# Patient Record
Sex: Male | Born: 1970 | Race: White | Hispanic: No | Marital: Married | Smoking: Former smoker
Health system: Southern US, Community
[De-identification: ages and names within clinical notes are randomized; demographics above are authoritative.]

## PROBLEM LIST (undated history)

## (undated) DIAGNOSIS — L409 Psoriasis, unspecified: Secondary | ICD-10-CM

## (undated) HISTORY — PX: EYE SURGERY: SHX253

## (undated) HISTORY — PX: WISDOM TOOTH EXTRACTION: SHX21

## (undated) HISTORY — DX: Psoriasis, unspecified: L40.9

---

## 2014-09-22 ENCOUNTER — Ambulatory Visit (INDEPENDENT_AMBULATORY_CARE_PROVIDER_SITE_OTHER): Payer: BLUE CROSS/BLUE SHIELD | Admitting: Internal Medicine

## 2014-09-22 ENCOUNTER — Telehealth (HOSPITAL_COMMUNITY): Payer: Self-pay

## 2014-09-22 ENCOUNTER — Other Ambulatory Visit (INDEPENDENT_AMBULATORY_CARE_PROVIDER_SITE_OTHER): Payer: BLUE CROSS/BLUE SHIELD

## 2014-09-22 ENCOUNTER — Encounter: Payer: Self-pay | Admitting: Internal Medicine

## 2014-09-22 VITALS — BP 126/82 | HR 80 | Temp 98.9°F | Resp 16 | Ht 74.0 in | Wt 191.0 lb

## 2014-09-22 DIAGNOSIS — Z0000 Encounter for general adult medical examination without abnormal findings: Secondary | ICD-10-CM | POA: Diagnosis not present

## 2014-09-22 DIAGNOSIS — L409 Psoriasis, unspecified: Secondary | ICD-10-CM | POA: Insufficient documentation

## 2014-09-22 DIAGNOSIS — Z23 Encounter for immunization: Secondary | ICD-10-CM

## 2014-09-22 DIAGNOSIS — K921 Melena: Secondary | ICD-10-CM | POA: Insufficient documentation

## 2014-09-22 DIAGNOSIS — R9431 Abnormal electrocardiogram [ECG] [EKG]: Secondary | ICD-10-CM | POA: Insufficient documentation

## 2014-09-22 DIAGNOSIS — Z136 Encounter for screening for cardiovascular disorders: Secondary | ICD-10-CM

## 2014-09-22 LAB — COMPREHENSIVE METABOLIC PANEL
ALK PHOS: 48 U/L (ref 39–117)
ALT: 35 U/L (ref 0–53)
AST: 26 U/L (ref 0–37)
Albumin: 4.8 g/dL (ref 3.5–5.2)
BILIRUBIN TOTAL: 0.7 mg/dL (ref 0.2–1.2)
BUN: 14 mg/dL (ref 6–23)
CO2: 32 mEq/L (ref 19–32)
Calcium: 9.8 mg/dL (ref 8.4–10.5)
Chloride: 103 mEq/L (ref 96–112)
Creatinine, Ser: 0.88 mg/dL (ref 0.40–1.50)
GFR: 99.26 mL/min (ref >60.00)
Glucose, Bld: 108 mg/dL — ABNORMAL HIGH (ref 70–99)
POTASSIUM: 4.7 meq/L (ref 3.5–5.1)
Sodium: 140 mEq/L (ref 135–145)
TOTAL PROTEIN: 7.5 g/dL (ref 6.0–8.3)

## 2014-09-22 LAB — CBC WITH DIFFERENTIAL/PLATELET
Basophils Absolute: 0 10*3/uL (ref 0.0–0.1)
Basophils Relative: 0.6 % (ref 0.0–3.0)
EOS ABS: 0.1 10*3/uL (ref 0.0–0.7)
EOS PCT: 1.8 % (ref 0.0–5.0)
HCT: 45.6 % (ref 39.0–52.0)
Hemoglobin: 15.8 g/dL (ref 13.0–17.0)
LYMPHS ABS: 1.5 10*3/uL (ref 0.7–4.0)
LYMPHS PCT: 22.1 % (ref 12.0–46.0)
MCHC: 34.6 g/dL (ref 30.0–36.0)
MCV: 89.7 fl (ref 78.0–100.0)
Monocytes Absolute: 0.7 10*3/uL (ref 0.1–1.0)
Monocytes Relative: 10 % (ref 3.0–12.0)
NEUTROS PCT: 65.5 % (ref 43.0–77.0)
Neutro Abs: 4.3 10*3/uL (ref 1.4–7.7)
Platelets: 279 10*3/uL (ref 150.0–400.0)
RBC: 5.08 Mil/uL (ref 4.22–5.81)
RDW: 13 % (ref 11.5–15.5)
WBC: 6.6 10*3/uL (ref 4.0–10.5)

## 2014-09-22 LAB — LIPID PANEL
Cholesterol: 182 mg/dL (ref 0–200)
HDL: 66.1 mg/dL (ref >39.00)
LDL CALC: 105 mg/dL — AB (ref 0–99)
NonHDL: 116.06
TRIGLYCERIDES: 56 mg/dL (ref 0.0–149.0)
Total CHOL/HDL Ratio: 3
VLDL: 11.2 mg/dL (ref 0.0–40.0)

## 2014-09-22 LAB — TSH: TSH: 3.74 u[IU]/mL (ref 0.35–4.50)

## 2014-09-22 LAB — FECAL OCCULT BLOOD, GUAIAC: Fecal Occult Blood: POSITIVE

## 2014-09-22 NOTE — Progress Notes (Signed)
Subjective:  Patient ID: Samuel Wong, male    DOB: 08/30/1970  Age: 44 y.o. MRN: [id number]  CC: Annual Exam   HPI [name] presents for a complete physical. His main concern today is whether or not he has heart disease. He has been told by dentist that he has severe gingivitis and he has a history of psoriasis and knows that these are risk factors for coronary artery disease. He also had a second degree relative that had a heart attack and died at age of 20. He currently exercises doing speed walking for 25 minutes about 3-4 times a week and does not have any dyspnea on exertion, chest pain, dizziness, diaphoresis or decreased endurance. He also complains of intermittent episodes of painless blood in his stool for several years.  History Rayn has a past medical history of Psoriasis.   He has no past surgical history on file.   His family history includes Arthritis in his mother; Heart disease in his paternal grandfather. There is no history of Cancer, Hyperlipidemia, Hypertension, Stroke, Kidney disease, COPD, Diabetes, Early death, or Alcohol abuse.He reports that he quit smoking about 16 years ago. He has never used smokeless tobacco. He reports that he drinks about 9.0 oz of alcohol per week. He reports that he does not use illicit drugs.  No outpatient prescriptions prior to visit.   No facility-administered medications prior to visit.    ROS Review of Systems  Constitutional: Negative.  Negative for fever, chills, diaphoresis, appetite change and fatigue.  HENT: Negative.  Negative for trouble swallowing and voice change.   Eyes: Negative.   Respiratory: Negative.  Negative for apnea, cough, choking, chest tightness, shortness of breath, wheezing and stridor.   Cardiovascular: Negative.  Negative for chest pain, palpitations and leg swelling.  Gastrointestinal: Positive for blood in stool and anal bleeding. Negative for vomiting, abdominal pain, diarrhea,  constipation, abdominal distention and rectal pain.  Endocrine: Negative.   Genitourinary: Negative.  Negative for difficulty urinating.  Musculoskeletal: Negative.   Skin: Negative.   Allergic/Immunologic: Negative.   Neurological: Negative.  Negative for dizziness, tremors, weakness and light-headedness.  Hematological: Negative.   Psychiatric/Behavioral: Negative.     Objective:  BP 126/82 mmHg  Pulse 80  Temp(Src) 98.9 F (37.2 C) (Oral)  Resp 16  Ht 6\' 2"  (1.88 m)  Wt 191 lb (86.637 kg)  BMI 24.51 kg/m2  SpO2 97%  Physical Exam  Constitutional: He appears well-developed and well-nourished. No distress.  HENT:  Head: Normocephalic and atraumatic.  Mouth/Throat: Oropharynx is clear and moist. No oropharyngeal exudate.  Eyes: Conjunctivae are normal. Right eye exhibits no discharge. Left eye exhibits no discharge. No scleral icterus.  Neck: Normal range of motion. Neck supple. No JVD present. No tracheal deviation present. No thyromegaly present.  Cardiovascular: Normal rate, regular rhythm, normal heart sounds and intact distal pulses.  Exam reveals no gallop and no friction rub.   No murmur heard. Pulses:      Carotid pulses are 1+ on the right side, and 1+ on the left side.      Radial pulses are 1+ on the right side, and 1+ on the left side.       Femoral pulses are 1+ on the right side, and 1+ on the left side.      Popliteal pulses are 1+ on the right side, and 1+ on the left side.       Dorsalis pedis pulses are 1+ on the right side, and 1+  on the left side.       Posterior tibial pulses are 1+ on the right side, and 1+ on the left side.  EKG today  Sinus  Rhythm  -Anterolateral ST-elevation -repolarization variant.   PROBABLY NORMAL   Pulmonary/Chest: Effort normal and breath sounds normal. No stridor. No respiratory distress. He has no wheezes. He has no rales. He exhibits no tenderness.  Abdominal: Soft. Bowel sounds are normal. He exhibits no distension and  no mass. There is no tenderness. There is no rebound and no guarding. Hernia confirmed negative in the right inguinal area and confirmed negative in the left inguinal area.  Genitourinary: Prostate normal, testes normal and penis normal. Rectal exam shows external hemorrhoid and internal hemorrhoid. Rectal exam shows no fissure, no mass, no tenderness and anal tone normal. Guaiac positive stool. Prostate is not enlarged and not tender. Right testis shows no mass, no swelling and no tenderness. Right testis is descended. Left testis shows no mass, no swelling and no tenderness. Left testis is descended. Circumcised. No penile erythema or penile tenderness. No discharge found.  Lymphadenopathy:    He has no cervical adenopathy.       Right: No inguinal adenopathy present.       Left: No inguinal adenopathy present.  Skin: He is not diaphoretic.  Vitals reviewed.     Assessment & Plan:   Luigi was seen today for annual exam.  Diagnoses and all orders for this visit:  Psoriasis  Blood in stool- this is most likely hemorrhoidal bleeding but he will need lower endoscopy to rule out other causes such as colon cancer or or colon polyps. Orders: -     Ambulatory referral to Gastroenterology  Routine general medical examination at a health care facility- exam done, he was given a Tdap booster, labs ordered and will be reviewed. He was referred for colonoscopy. He was given patient education material. Orders: -     Lipid panel; Future -     Comprehensive metabolic panel; Future -     CBC with Differential/Platelet; Future -     TSH; Future  Screening, ischemic heart disease Orders: -     EKG 12-Lead  Need for Tdap vaccination Orders: -     Tdap vaccine greater than or equal to 7yo IM  Nonspecific abnormal electrocardiogram (ECG) (EKG)- he does not have any symptoms of ischemic heart disease however he does have risk factors and his EKG is mildly abnormal. I therefore asked him to have a  myocardial perfusion imaging scan done to screen for coronary artery disease. Orders: -     Myocardial Perfusion Imaging; Future   Mr. Pistole does not currently have medications on file.  No orders of the defined types were placed in this encounter.     Follow-up: Return in about 3 months (around 12/23/2014).  Scarlette Calico, MD

## 2014-09-22 NOTE — Patient Instructions (Signed)

## 2014-09-22 NOTE — Progress Notes (Signed)
Pre visit review using our clinic review tool, if applicable. No additional management support is needed unless otherwise documented below in the visit note. 

## 2014-09-22 NOTE — Telephone Encounter (Signed)
Patient given detailed instructions per Myocardial Perfusion Study Information Sheet for test on 09-23-2014 at 0745. Patient Notified to arrive 15 minutes early, and that it is imperative to arrive on time for appointment to keep from having the test rescheduled. Patient verbalized understanding. Oletta Lamas, Cintia Gleed A

## 2014-09-23 ENCOUNTER — Ambulatory Visit (HOSPITAL_COMMUNITY): Payer: BLUE CROSS/BLUE SHIELD | Attending: Internal Medicine

## 2014-09-23 DIAGNOSIS — R9431 Abnormal electrocardiogram [ECG] [EKG]: Secondary | ICD-10-CM | POA: Diagnosis not present

## 2014-09-23 DIAGNOSIS — R9439 Abnormal result of other cardiovascular function study: Secondary | ICD-10-CM | POA: Insufficient documentation

## 2014-09-23 LAB — MYOCARDIAL PERFUSION IMAGING
CHL CUP NUCLEAR SSS: 3
CHL CUP RESTING HR STRESS: 59 {beats}/min
CHL RATE OF PERCEIVED EXERTION: 15
CSEPEDS: 33 s
CSEPPHR: 179 {beats}/min
Estimated workload: 14.4 METS
Exercise duration (min): 12 min
LHR: 0.28
LV dias vol: 119 mL
LV sys vol: 55 mL
MPHR: 175 {beats}/min
Percent HR: 1 %
SDS: 3
SRS: 0
TID: 0.84

## 2014-09-23 MED ORDER — TECHNETIUM TC 99M SESTAMIBI GENERIC - CARDIOLITE
30.7000 | Freq: Once | INTRAVENOUS | Status: AC | PRN
  Administered 2014-09-23: 30.7 via INTRAVENOUS

## 2014-09-23 MED ORDER — TECHNETIUM TC 99M SESTAMIBI GENERIC - CARDIOLITE
9.9000 | Freq: Once | INTRAVENOUS | Status: AC | PRN
  Administered 2014-09-23: 10 via INTRAVENOUS

## 2014-09-25 ENCOUNTER — Encounter: Payer: Self-pay | Admitting: Internal Medicine

## 2014-12-27 ENCOUNTER — Ambulatory Visit: Payer: BLUE CROSS/BLUE SHIELD | Admitting: Internal Medicine

## 2015-08-26 ENCOUNTER — Encounter (HOSPITAL_COMMUNITY)
Admission: EM | Disposition: A | Discharge status: Home / Self Care | Payer: Self-pay | Source: Home / Self Care | Attending: Emergency Medicine

## 2015-08-26 ENCOUNTER — Emergency Department (HOSPITAL_COMMUNITY): Payer: BLUE CROSS/BLUE SHIELD

## 2015-08-26 ENCOUNTER — Observation Stay (HOSPITAL_COMMUNITY): Payer: BLUE CROSS/BLUE SHIELD | Admitting: Anesthesiology

## 2015-08-26 ENCOUNTER — Encounter (HOSPITAL_COMMUNITY): Payer: Self-pay | Admitting: Emergency Medicine

## 2015-08-26 ENCOUNTER — Observation Stay (HOSPITAL_COMMUNITY)
Admission: EM | Admit: 2015-08-26 | Discharge: 2015-08-27 | Disposition: A | Discharge status: Home / Self Care | Payer: BLUE CROSS/BLUE SHIELD | Attending: Surgery | Admitting: Surgery

## 2015-08-26 ENCOUNTER — Telehealth: Payer: Self-pay | Admitting: General Surgery

## 2015-08-26 DIAGNOSIS — Z87891 Personal history of nicotine dependence: Secondary | ICD-10-CM | POA: Insufficient documentation

## 2015-08-26 DIAGNOSIS — K3580 Unspecified acute appendicitis: Secondary | ICD-10-CM | POA: Diagnosis not present

## 2015-08-26 HISTORY — PX: LAPAROSCOPIC APPENDECTOMY: SHX408

## 2015-08-26 LAB — URINALYSIS, ROUTINE W REFLEX MICROSCOPIC
BILIRUBIN URINE: NEGATIVE
Glucose, UA: NEGATIVE mg/dL
Hgb urine dipstick: NEGATIVE
KETONES UR: NEGATIVE mg/dL
NITRITE: NEGATIVE
PH: 6.5 (ref 5.0–8.0)
PROTEIN: NEGATIVE mg/dL
Specific Gravity, Urine: 1.011 (ref 1.005–1.030)

## 2015-08-26 LAB — CBC
HEMATOCRIT: 40.8 % (ref 39.0–52.0)
HEMOGLOBIN: 14.9 g/dL (ref 13.0–17.0)
MCH: 31.5 pg (ref 26.0–34.0)
MCHC: 36.5 g/dL — ABNORMAL HIGH (ref 30.0–36.0)
MCV: 86.3 fL (ref 78.0–100.0)
PLATELETS: 239 10*3/uL (ref 150–400)
RBC: 4.73 MIL/uL (ref 4.22–5.81)
RDW: 12.3 % (ref 11.5–15.5)
WBC: 20.1 10*3/uL — AB (ref 4.0–10.5)

## 2015-08-26 LAB — COMPREHENSIVE METABOLIC PANEL
ALT: 26 U/L (ref 17–63)
AST: 25 U/L (ref 15–41)
Albumin: 4 g/dL (ref 3.5–5.0)
Alkaline Phosphatase: 48 U/L (ref 38–126)
Anion gap: 10 (ref 5–15)
BUN: 14 mg/dL (ref 6–20)
CHLORIDE: 103 mmol/L (ref 101–111)
CO2: 23 mmol/L (ref 22–32)
Calcium: 9.2 mg/dL (ref 8.9–10.3)
Creatinine, Ser: 0.98 mg/dL (ref 0.61–1.24)
Glucose, Bld: 135 mg/dL — ABNORMAL HIGH (ref 65–99)
POTASSIUM: 3.5 mmol/L (ref 3.5–5.1)
Sodium: 136 mmol/L (ref 135–145)
Total Bilirubin: 1.5 mg/dL — ABNORMAL HIGH (ref 0.3–1.2)
Total Protein: 6.8 g/dL (ref 6.5–8.1)

## 2015-08-26 LAB — URINE MICROSCOPIC-ADD ON: RBC / HPF: NONE SEEN RBC/hpf (ref 0–5)

## 2015-08-26 LAB — LIPASE, BLOOD: LIPASE: 22 U/L (ref 11–51)

## 2015-08-26 SURGERY — APPENDECTOMY, LAPAROSCOPIC
Anesthesia: General | Site: Abdomen

## 2015-08-26 MED ORDER — 0.9 % SODIUM CHLORIDE (POUR BTL) OPTIME
TOPICAL | Status: DC | PRN
  Administered 2015-08-26: 1000 mL

## 2015-08-26 MED ORDER — METRONIDAZOLE IN NACL 5-0.79 MG/ML-% IV SOLN
500.0000 mg | Freq: Once | INTRAVENOUS | Status: AC
  Administered 2015-08-26: 500 mg via INTRAVENOUS
  Filled 2015-08-26: qty 100

## 2015-08-26 MED ORDER — FENTANYL CITRATE (PF) 100 MCG/2ML IJ SOLN
INTRAMUSCULAR | Status: DC | PRN
  Administered 2015-08-26 (×2): 50 ug via INTRAVENOUS
  Administered 2015-08-26: 100 ug via INTRAVENOUS
  Administered 2015-08-26: 50 ug via INTRAVENOUS

## 2015-08-26 MED ORDER — KETOROLAC TROMETHAMINE 30 MG/ML IJ SOLN
INTRAMUSCULAR | Status: AC
  Filled 2015-08-26: qty 1

## 2015-08-26 MED ORDER — ACETAMINOPHEN 10 MG/ML IV SOLN
INTRAVENOUS | Status: AC
  Filled 2015-08-26: qty 100

## 2015-08-26 MED ORDER — PROPOFOL 10 MG/ML IV BOLUS
INTRAVENOUS | Status: AC
  Filled 2015-08-26: qty 20

## 2015-08-26 MED ORDER — ONDANSETRON HCL 4 MG/2ML IJ SOLN
INTRAMUSCULAR | Status: AC
  Filled 2015-08-26: qty 2

## 2015-08-26 MED ORDER — OXYCODONE HCL 5 MG/5ML PO SOLN: 5.0000 mg | Freq: Once | ORAL | Status: DC | PRN

## 2015-08-26 MED ORDER — KETOROLAC TROMETHAMINE 30 MG/ML IJ SOLN
INTRAMUSCULAR | Status: DC | PRN
  Administered 2015-08-26: 30 mg via INTRAVENOUS

## 2015-08-26 MED ORDER — FENTANYL CITRATE (PF) 250 MCG/5ML IJ SOLN
INTRAMUSCULAR | Status: AC
  Filled 2015-08-26: qty 5

## 2015-08-26 MED ORDER — BUPIVACAINE-EPINEPHRINE 0.5% -1:200000 IJ SOLN
INTRAMUSCULAR | Status: DC | PRN
  Administered 2015-08-26: 20 mL

## 2015-08-26 MED ORDER — MIDAZOLAM HCL 5 MG/5ML IJ SOLN
INTRAMUSCULAR | Status: DC | PRN
  Administered 2015-08-26: 2 mg via INTRAVENOUS

## 2015-08-26 MED ORDER — ONDANSETRON HCL 4 MG/2ML IJ SOLN
4.0000 mg | Freq: Four times a day (QID) | INTRAMUSCULAR | Status: DC | PRN
  Administered 2015-08-26 (×2): 4 mg via INTRAVENOUS
  Filled 2015-08-26: qty 2

## 2015-08-26 MED ORDER — PROPOFOL 10 MG/ML IV BOLUS
INTRAVENOUS | Status: DC | PRN
  Administered 2015-08-26: 140 mg via INTRAVENOUS
  Administered 2015-08-26: 60 mg via INTRAVENOUS

## 2015-08-26 MED ORDER — OXYCODONE-ACETAMINOPHEN 5-325 MG PO TABS
1.0000 | ORAL_TABLET | ORAL | Status: DC | PRN
  Administered 2015-08-26 – 2015-08-27 (×5): 2 via ORAL
  Filled 2015-08-26 (×6): qty 2

## 2015-08-26 MED ORDER — SODIUM CHLORIDE 0.9 % IV SOLN
INTRAVENOUS | Status: DC
  Administered 2015-08-26: 11:00:00 via INTRAVENOUS

## 2015-08-26 MED ORDER — HYDROMORPHONE HCL 1 MG/ML IJ SOLN: 1.0000 mg | INTRAMUSCULAR | Status: DC | PRN

## 2015-08-26 MED ORDER — ARTIFICIAL TEARS OP OINT
TOPICAL_OINTMENT | OPHTHALMIC | Status: DC | PRN
  Administered 2015-08-26: 1 via OPHTHALMIC

## 2015-08-26 MED ORDER — ROCURONIUM BROMIDE 100 MG/10ML IV SOLN
INTRAVENOUS | Status: DC | PRN
  Administered 2015-08-26: 50 mg via INTRAVENOUS

## 2015-08-26 MED ORDER — SODIUM CHLORIDE 0.9 % IR SOLN
Status: DC | PRN
  Administered 2015-08-26: 1000 mL

## 2015-08-26 MED ORDER — LIDOCAINE HCL (CARDIAC) 20 MG/ML IV SOLN
INTRAVENOUS | Status: DC | PRN
Start: 2015-08-26 — End: 2015-08-26
  Administered 2015-08-26: 70 mg via INTRAVENOUS

## 2015-08-26 MED ORDER — DEXAMETHASONE SODIUM PHOSPHATE 10 MG/ML IJ SOLN
INTRAMUSCULAR | Status: DC | PRN
  Administered 2015-08-26: 10 mg via INTRAVENOUS

## 2015-08-26 MED ORDER — ACETAMINOPHEN 10 MG/ML IV SOLN
INTRAVENOUS | Status: DC | PRN
  Administered 2015-08-26: 1000 mg via INTRAVENOUS

## 2015-08-26 MED ORDER — HYDROMORPHONE HCL 1 MG/ML IJ SOLN
0.5000 mg | INTRAMUSCULAR | Status: DC | PRN
  Administered 2015-08-26: 0.5 mg via INTRAVENOUS
  Filled 2015-08-26: qty 1

## 2015-08-26 MED ORDER — ROCURONIUM BROMIDE 50 MG/5ML IV SOLN
INTRAVENOUS | Status: AC
  Filled 2015-08-26: qty 1

## 2015-08-26 MED ORDER — BUPIVACAINE-EPINEPHRINE (PF) 0.5% -1:200000 IJ SOLN
INTRAMUSCULAR | Status: AC
  Filled 2015-08-26: qty 30

## 2015-08-26 MED ORDER — ONDANSETRON 4 MG PO TBDP: 4.0000 mg | ORAL_TABLET | Freq: Four times a day (QID) | ORAL | Status: DC | PRN

## 2015-08-26 MED ORDER — DEXAMETHASONE SODIUM PHOSPHATE 10 MG/ML IJ SOLN
INTRAMUSCULAR | Status: AC
  Filled 2015-08-26: qty 1

## 2015-08-26 MED ORDER — HYDROMORPHONE HCL 1 MG/ML IJ SOLN
1.0000 mg | Freq: Once | INTRAMUSCULAR | Status: AC
  Administered 2015-08-26: 1 mg via INTRAVENOUS
  Filled 2015-08-26: qty 1

## 2015-08-26 MED ORDER — LACTATED RINGERS IV SOLN
INTRAVENOUS | Status: DC
  Administered 2015-08-26 (×4): via INTRAVENOUS

## 2015-08-26 MED ORDER — NEOSTIGMINE METHYLSULFATE 10 MG/10ML IV SOLN
INTRAVENOUS | Status: DC | PRN
  Administered 2015-08-26: 5 mg via INTRAVENOUS

## 2015-08-26 MED ORDER — HYDROMORPHONE HCL 1 MG/ML IJ SOLN
1.0000 mg | INTRAMUSCULAR | Status: DC | PRN
  Administered 2015-08-26 (×2): 1 mg via INTRAVENOUS
  Filled 2015-08-26 (×2): qty 1

## 2015-08-26 MED ORDER — HYDROMORPHONE HCL 1 MG/ML IJ SOLN: 0.2500 mg | INTRAMUSCULAR | Status: DC | PRN

## 2015-08-26 MED ORDER — CEFTRIAXONE SODIUM 2 G IJ SOLR
2.0000 g | Freq: Once | INTRAMUSCULAR | Status: AC
  Administered 2015-08-26: 2 g via INTRAVENOUS
  Filled 2015-08-26: qty 2

## 2015-08-26 MED ORDER — IOPAMIDOL (ISOVUE-300) INJECTION 61%
INTRAVENOUS | Status: AC
  Administered 2015-08-26: 100 mL via INTRAVENOUS
  Filled 2015-08-26: qty 100

## 2015-08-26 MED ORDER — GLYCOPYRROLATE 0.2 MG/ML IJ SOLN
INTRAMUSCULAR | Status: DC | PRN
  Administered 2015-08-26: 0.6 mg via INTRAVENOUS

## 2015-08-26 MED ORDER — GLYCOPYRROLATE 0.2 MG/ML IV SOSY
PREFILLED_SYRINGE | INTRAVENOUS | Status: AC
  Filled 2015-08-26: qty 6

## 2015-08-26 MED ORDER — OXYCODONE HCL 5 MG PO TABS: 5.0000 mg | ORAL_TABLET | Freq: Once | ORAL | Status: DC | PRN

## 2015-08-26 MED ORDER — MIDAZOLAM HCL 2 MG/2ML IJ SOLN
INTRAMUSCULAR | Status: AC
  Filled 2015-08-26: qty 2

## 2015-08-26 MED ORDER — ONDANSETRON HCL 4 MG/2ML IJ SOLN
4.0000 mg | Freq: Once | INTRAMUSCULAR | Status: AC | PRN
  Administered 2015-08-26: 4 mg via INTRAVENOUS
  Filled 2015-08-26: qty 2

## 2015-08-26 SURGICAL SUPPLY — 43 items
APPLIER CLIP 5 13 M/L LIGAMAX5 (MISCELLANEOUS)
APPLIER CLIP ROT 10 11.4 M/L (STAPLE)
CANISTER SUCTION 2500CC (MISCELLANEOUS) ×3 IMPLANT
CHLORAPREP W/TINT 26ML (MISCELLANEOUS) ×3 IMPLANT
CLIP APPLIE 5 13 M/L LIGAMAX5 (MISCELLANEOUS) IMPLANT
CLIP APPLIE ROT 10 11.4 M/L (STAPLE) IMPLANT
COVER SURGICAL LIGHT HANDLE (MISCELLANEOUS) ×3 IMPLANT
CUTTER FLEX LINEAR 45M (STAPLE) IMPLANT
DECANTER SPIKE VIAL GLASS SM (MISCELLANEOUS) ×3 IMPLANT
ELECT REM PT RETURN 9FT ADLT (ELECTROSURGICAL) ×3
ELECTRODE REM PT RTRN 9FT ADLT (ELECTROSURGICAL) ×1 IMPLANT
GLOVE BIO SURGEON STRL SZ 6.5 (GLOVE) ×2 IMPLANT
GLOVE BIO SURGEONS STRL SZ 6.5 (GLOVE) ×1
GLOVE BIOGEL PI IND STRL 7.0 (GLOVE) ×1 IMPLANT
GLOVE BIOGEL PI INDICATOR 7.0 (GLOVE) ×2
GLOVE ECLIPSE 7.5 STRL STRAW (GLOVE) ×3 IMPLANT
GLOVE SURG SIGNA 7.5 PF LTX (GLOVE) ×3 IMPLANT
GLOVE SURG SS PI 6.5 STRL IVOR (GLOVE) ×3 IMPLANT
GOWN STRL REUS W/ TWL LRG LVL3 (GOWN DISPOSABLE) ×3 IMPLANT
GOWN STRL REUS W/ TWL XL LVL3 (GOWN DISPOSABLE) ×1 IMPLANT
GOWN STRL REUS W/TWL LRG LVL3 (GOWN DISPOSABLE) ×6
GOWN STRL REUS W/TWL XL LVL3 (GOWN DISPOSABLE) ×2
KIT BASIN OR (CUSTOM PROCEDURE TRAY) ×3 IMPLANT
KIT ROOM TURNOVER OR (KITS) ×3 IMPLANT
LIQUID BAND (GAUZE/BANDAGES/DRESSINGS) ×3 IMPLANT
NS IRRIG 1000ML POUR BTL (IV SOLUTION) ×3 IMPLANT
PAD ARMBOARD 7.5X6 YLW CONV (MISCELLANEOUS) ×6 IMPLANT
POUCH SPECIMEN RETRIEVAL 10MM (ENDOMECHANICALS) ×3 IMPLANT
RELOAD 45 VASCULAR/THIN (ENDOMECHANICALS) IMPLANT
RELOAD STAPLE TA45 3.5 REG BLU (ENDOMECHANICALS) ×3 IMPLANT
SCALPEL HARMONIC ACE (MISCELLANEOUS) ×3 IMPLANT
SET IRRIG TUBING LAPAROSCOPIC (IRRIGATION / IRRIGATOR) ×3 IMPLANT
SLEEVE ENDOPATH XCEL 5M (ENDOMECHANICALS) ×3 IMPLANT
SPECIMEN JAR SMALL (MISCELLANEOUS) ×3 IMPLANT
SUT MNCRL AB 4-0 PS2 18 (SUTURE) ×3 IMPLANT
SUT MON AB 4-0 PC3 18 (SUTURE) ×3 IMPLANT
TOWEL OR 17X24 6PK STRL BLUE (TOWEL DISPOSABLE) ×3 IMPLANT
TOWEL OR 17X26 10 PK STRL BLUE (TOWEL DISPOSABLE) ×3 IMPLANT
TRAY FOLEY CATH SILVER 14FR (SET/KITS/TRAYS/PACK) ×3 IMPLANT
TRAY LAPAROSCOPIC MC (CUSTOM PROCEDURE TRAY) ×3 IMPLANT
TROCAR XCEL BLUNT TIP 100MML (ENDOMECHANICALS) ×3 IMPLANT
TROCAR XCEL NON-BLD 5MMX100MML (ENDOMECHANICALS) ×3 IMPLANT
TUBING INSUFFLATION (TUBING) ×3 IMPLANT

## 2015-08-26 NOTE — ED Provider Notes (Signed)
CSN: [id number]     Arrival date & time 08/26/15  0612 History   First MD Initiated Contact with Patient 08/26/15 [phone]     Chief Complaint  Patient presents with  . Abdominal Pain     (Consider location/radiation/quality/duration/timing/severity/associated sxs/prior Treatment) HPI Samuel Wong is a 45 y.o. male with no medical problems, presents to ED with complaint of abdominal pain. Pt states pain started yesterday. States started mild and worsened since. Patient states that pain is sharp, radiates all over his abdomen. Denies a bowel movement in the last 24 hours. He reports some nausea and one episode of emesis yesterday. He states pain is worsened with any movement or palpation of his abdomen. Denies any known fever. Denies any chills. He has not eaten since yesterday morning. He did have 2 beers last night. He took some Pepto-Bismol which she states did not help. He denies any prior abdominal issues or surgeries. Denies any back pain. Denies any pain or swelling in his scrotum. Denies any urinary symptoms. No hx of same pain  Past Medical History  Diagnosis Date  . Psoriasis    Past Surgical History  Procedure Laterality Date  . Eye surgery    . Wisdom tooth extraction     Family History  Problem Relation Age of Onset  . Arthritis Mother   . Heart disease Paternal Grandfather   . Cancer Neg Hx   . Hyperlipidemia Neg Hx   . Hypertension Neg Hx   . Stroke Neg Hx   . Kidney disease Neg Hx   . COPD Neg Hx   . Diabetes Neg Hx   . Early death Neg Hx   . Alcohol abuse Neg Hx    Social History  Substance Use Topics  . Smoking status: Former Smoker -- 10.00 packs/day    Quit date: 02/19/1998  . Smokeless tobacco: Never Used  . Alcohol Use: 9.0 oz/week    15 Cans of beer, 0 Standard drinks or equivalent per week    Review of Systems  Constitutional: Negative for fever and chills.  Respiratory: Negative for cough, chest tightness and shortness of breath.    Cardiovascular: Negative for chest pain, palpitations and leg swelling.  Gastrointestinal: Positive for nausea, vomiting and abdominal pain. Negative for diarrhea and abdominal distention.  Genitourinary: Negative for dysuria, urgency, frequency and hematuria.  Musculoskeletal: Negative for myalgias, arthralgias, neck pain and neck stiffness.  Skin: Negative for rash.  Allergic/Immunologic: Negative for immunocompromised state.  Neurological: Negative for dizziness, weakness, light-headedness, numbness and headaches.  All other systems reviewed and are negative.     Allergies  Review of patient's allergies indicates no known allergies.  Home Medications   Prior to Admission medications   Not on File   BP 126/69 mmHg  Pulse 93  Temp(Src) 97.8 F (36.6 C) (Oral)  Ht 6\' 1"  (1.854 m)  Wt 92.08 kg  BMI 26.79 kg/m2  SpO2 100% Physical Exam  Constitutional: He appears well-developed and well-nourished. No distress.  HENT:  Head: Normocephalic and atraumatic.  Eyes: Conjunctivae are normal.  Neck: Neck supple.  Cardiovascular: Normal rate, regular rhythm and normal heart sounds.   Pulmonary/Chest: Effort normal. No respiratory distress. He has no wheezes. He has no rales.  Abdominal: Soft. Bowel sounds are normal. He exhibits no distension. There is tenderness. There is no rebound.  RLQ tenderness with guarding  Musculoskeletal: He exhibits no edema.  Neurological: He is alert.  Skin: Skin is warm and dry.  Nursing note and vitals  reviewed.   ED Course  Procedures (including critical care time) Labs Review Labs Reviewed  LIPASE, BLOOD  COMPREHENSIVE METABOLIC PANEL  CBC  URINALYSIS, ROUTINE W REFLEX MICROSCOPIC (NOT AT [facility])    Imaging Review No results found. I have personally reviewed and evaluated these images and lab results as part of my medical decision-making.   EKG Interpretation None      MDM   Final diagnoses:  None   6:54 AM  Patient emergency  department with right lower quadrant pain. Exam is concerning for acute abdomen. CT scan of abd/pelvis ordered. Labs pending. VS normal at this time.   No pain improvement after 0.5 mg of Dilaudid. Labs show elevated white blood cell count of 20. CT scan is pending. The rest of the blood work is unremarkable.  8:03 AM Patient's pain is improved after 1 mg of Dilaudid. CT scan is positive for acute appendicitis with no evidence of an abscess or perforation. Will page general surgery. Antibiotics ordered.  Spoke with general surgery. Will admit.   Filed Vitals:   08/26/15 1400 08/26/15 1401 08/26/15 1424 08/26/15 1450  BP:  120/63 119/68 118/63  Pulse: 109 101 105 105  Temp: 100 F (37.8 C)  99 F (37.2 C) 99 F (37.2 C)  TempSrc:    Oral  Resp: 21 20 18 18   Height:      Weight:      SpO2: 93% 93% 95% 97%     Jeannett Senior, PA-C 08/26/15 [place], MD 08/27/15 [phone]

## 2015-08-26 NOTE — ED Notes (Signed)
Patient transported to CT 

## 2015-08-26 NOTE — Op Note (Signed)
Appendectomy, Lap, Procedure Note  Indications: The patient presented with a history of right-sided abdominal pain. A CT revealed findings consistent with acute appendicitis.  Pre-operative Diagnosis: acute appendicitis  Post-operative Diagnosis: Same  Surgeon: Coralie Keens A   Assistants: 0  Anesthesia: General endotracheal anesthesia  ASA Class: 2  Procedure Details  The patient was seen again in the Holding Room. The risks, benefits, complications, treatment options, and expected outcomes were discussed with the patient and/or family. The possibilities of reaction to medication, perforation of viscus, bleeding, recurrent infection, finding a normal appendix, the need for additional procedures, failure to diagnose a condition, and creating a complication requiring transfusion or operation were discussed. There was concurrence with the proposed plan and informed consent was obtained. The site of surgery was properly noted. The patient was taken to Operating Room, identified as Samuel Wong and the procedure verified as Appendectomy. A Time Out was held and the above information confirmed.  The patient was placed in the supine position and general anesthesia was induced, along with placement of orogastric tube, Venodyne boots, and a Foley catheter. The abdomen was prepped and draped in a sterile fashion. A one centimeter infraumbilical incision was made.  The  midline fascia was incised with a #15 blade.  A Kelly clamp was used to confirm entrance into the peritoneal cavity.  A pursestring suture was passed around the incision with a 0 Vicryl.  The Hasson was introduced into the abdomen and the tails of the suture were used to hold the Hasson in place.   The pneumoperitoneum was then established to steady pressure of 15 mmHg.  Additional 5 mm cannulas then placed in the left lower quadrant of the abdomen and the RUQ under direct visualization. A careful evaluation of the entire abdomen  was carried out. The patient was placed in Trendelenburg and left lateral decubitus position. The small intestines were retracted in the cephalad and left lateral direction away from the pelvis and right lower quadrant. The patient was found to have an enlarged and inflamed appendix that was extending into the pelvis. There was no evidence of perforation but it did have slight early necrosis and fibrinous exudate  The appendix was carefully dissected. The appendix was was skeletonized with the harmonic scalpel.   The appendix was divided at its base using an endo-GIA stapler. Minimal appendiceal stump was left in place. There was no evidence of bleeding, leakage, or complication after division of the appendix. Irrigation was also performed and irrigate suctioned from the abdomen as well.  The umbilical port site was closed with the purse string suture. There was no residual palpable fascial defect.  The trocar site skin wounds were closed with 4-0 Monocryl.  Instrument, sponge, and needle counts were correct at the conclusion of the case.   Findings: The appendix was found to be inflamed. There were signs of necrosis.  There was not perforation. There was not abscess formation.  Estimated Blood Loss:  Minimal         Drains:none         Complications:  None; patient tolerated the procedure well.         Disposition: PACU - hemodynamically stable.         Condition: stable

## 2015-08-26 NOTE — Anesthesia Preprocedure Evaluation (Signed)
Anesthesia Evaluation  Patient identified by MRN, date of birth, ID band Patient awake    Reviewed: Allergy & Precautions, NPO status , Patient's Chart, lab work & pertinent test results  History of Anesthesia Complications Negative for: history of anesthetic complications  Airway Mallampati: II  TM Distance: >3 FB Neck ROM: Full    Dental  (+) Teeth Intact   Pulmonary neg shortness of breath, neg sleep apnea, neg recent URI, former smoker,    breath sounds clear to auscultation       Cardiovascular negative cardio ROS   Rhythm:Regular     Neuro/Psych negative neurological ROS  negative psych ROS   GI/Hepatic negative GI ROS, Neg liver ROS,   Endo/Other  negative endocrine ROS  Renal/GU negative Renal ROS     Musculoskeletal negative musculoskeletal ROS (+)   Abdominal   Peds  Hematology negative hematology ROS (+)   Anesthesia Other Findings   Reproductive/Obstetrics                             Anesthesia Physical Anesthesia Plan  ASA: II  Anesthesia Plan: General   Post-op Pain Management:    Induction: Intravenous  Airway Management Planned: Oral ETT  Additional Equipment: None  Intra-op Plan:   Post-operative Plan: Extubation in OR  Informed Consent: I have reviewed the patients History and Physical, chart, labs and discussed the procedure including the risks, benefits and alternatives for the proposed anesthesia with the patient or authorized representative who has indicated his/her understanding and acceptance.   Dental advisory given  Plan Discussed with: CRNA and Surgeon  Anesthesia Plan Comments:         Anesthesia Quick Evaluation

## 2015-08-26 NOTE — H&P (Signed)
Samuel Wong is an 45 y.o. male.   Chief Complaint: RLQ Abdominal pain HPI: This is a 45 year old gentleman who presents with right lower quadrant abdominal pain. He reports the pain is sharp and radiating all over his abdomen. The pain started yesterday. He has had one episode of nausea and vomiting. He denies fevers. Bowel movements a been normal. He is otherwise healthy without complaints. The pain is described as moderate to severe.  Past Medical History  Diagnosis Date  . Psoriasis     Past Surgical History  Procedure Laterality Date  . Eye surgery    . Wisdom tooth extraction      Family History  Problem Relation Age of Onset  . Arthritis Mother   . Heart disease Paternal Grandfather   . Cancer Neg Hx   . Hyperlipidemia Neg Hx   . Hypertension Neg Hx   . Stroke Neg Hx   . Kidney disease Neg Hx   . COPD Neg Hx   . Diabetes Neg Hx   . Early death Neg Hx   . Alcohol abuse Neg Hx    Social History:  reports that he quit smoking about 17 years ago. He has never used smokeless tobacco. He reports that he drinks about 9.0 oz of alcohol per week. He reports that he does not use illicit drugs.  Allergies: No Known Allergies   (Not in a hospital admission)  Results for orders placed or performed during the hospital encounter of 08/26/15 (from the past 48 hour(s))  Lipase, blood     Status: None   Collection Time: 08/26/15  6:33 AM  Result Value Ref Range   Lipase 22 11 - 51 U/L  Comprehensive metabolic panel     Status: Abnormal   Collection Time: 08/26/15  6:33 AM  Result Value Ref Range   Sodium 136 135 - 145 mmol/L   Potassium 3.5 3.5 - 5.1 mmol/L   Chloride 103 101 - 111 mmol/L   CO2 23 22 - 32 mmol/L   Glucose, Bld 135 (H) 65 - 99 mg/dL   BUN 14 6 - 20 mg/dL   Creatinine, Ser 0.98 0.61 - 1.24 mg/dL   Calcium 9.2 8.9 - 10.3 mg/dL   Total Protein 6.8 6.5 - 8.1 g/dL   Albumin 4.0 3.5 - 5.0 g/dL   AST 25 15 - 41 U/L   ALT 26 17 - 63 U/L   Alkaline Phosphatase  48 38 - 126 U/L   Total Bilirubin 1.5 (H) 0.3 - 1.2 mg/dL   GFR calc non Af Amer >60 >60 mL/min   GFR calc Af Amer >60 >60 mL/min    Comment: (NOTE) The eGFR has been calculated using the CKD EPI equation. This calculation has not been validated in all clinical situations. eGFR's persistently <60 mL/min signify possible Chronic Kidney Disease.    Anion gap 10 5 - 15  CBC     Status: Abnormal   Collection Time: 08/26/15  6:33 AM  Result Value Ref Range   WBC 20.1 (H) 4.0 - 10.5 K/uL   RBC 4.73 4.22 - 5.81 MIL/uL   Hemoglobin 14.9 13.0 - 17.0 g/dL   HCT 40.8 39.0 - 52.0 %   MCV 86.3 78.0 - 100.0 fL   MCH 31.5 26.0 - 34.0 pg   MCHC 36.5 (H) 30.0 - 36.0 g/dL   RDW 12.3 11.5 - 15.5 %   Platelets 239 150 - 400 K/uL   Ct Abdomen Pelvis W Contrast  08/26/2015  CLINICAL DATA:  Diffuse abdominal pain, onset 3 p.m. yesterday. Now with right lower quadrant pain. EXAM: CT ABDOMEN AND PELVIS WITH CONTRAST TECHNIQUE: Multidetector CT imaging of the abdomen and pelvis was performed using the standard protocol following bolus administration of intravenous contrast. CONTRAST:  100 cc Isovue-300 COMPARISON:  None. FINDINGS: Lower chest:  No acute findings. Hepatobiliary: No masses or other significant abnormality. Pancreas: No mass, inflammatory changes, or other significant abnormality. Spleen: Within normal limits in size and appearance. Small hypodense lesion within the posterior spleen is too small to characterize but most likely a benign cyst or hemangioma. Adrenals/Urinary Tract: No masses identified. No evidence of hydronephrosis. Stomach/Bowel: Appendix is distended to approximately 14 mm. Walls of the appendix are thickened and there is periappendiceal fluid/inflammation. Multiple appendicoliths are identified, including a 6 mm appendicolith at the base of the appendix. No large or small bowel dilatation.  Stomach appears normal. Vascular/Lymphatic: No pathologically enlarged lymph nodes. No evidence  of abdominal aortic aneurysm. Reproductive: No mass or other significant abnormality. Other: No abscess collections seen.  No free intraperitoneal air. Musculoskeletal: No acute or suspicious osseous finding. Superficial soft tissues are unremarkable. IMPRESSION: 1. Acute appendicitis. Appendix distended to approximately 14 mm. Walls of the appendix are thickened and there is associated periappendiceal inflammation/fluid stranding. Multiple appendicoliths, including a 6 mm appendicolith at the base of the appendix. No abscess collection. No free intraperitoneal air. 2. Remainder of the abdomen and pelvis CT is unremarkable. These results were called by telephone at the time of interpretation on 08/26/2015 at 7:56 am to Dr. Jeannett Senior , who verbally acknowledged these results. Electronically Signed   By: Franki Cabot M.D.   On: 08/26/2015 08:00    Review of Systems  All other systems reviewed and are negative.   Blood pressure 121/82, pulse 88, temperature 97.8 F (36.6 C), temperature source Oral, resp. rate 16, height 6' 1"  (1.854 m), weight 92.08 kg (203 lb), SpO2 97 %. Physical Exam  Constitutional: He is oriented to person, place, and time. He appears well-developed and well-nourished. No distress.  Slightly uncomfortable in appearance  HENT:  Head: Normocephalic and atraumatic.  Right Ear: External ear normal.  Left Ear: External ear normal.  Nose: Nose normal.  Mouth/Throat: No oropharyngeal exudate.  Eyes: Conjunctivae are normal. Pupils are equal, round, and reactive to light. No scleral icterus.  Neck: Normal range of motion. Neck supple. No tracheal deviation present.  Cardiovascular: Normal rate, regular rhythm, normal heart sounds and intact distal pulses.   No murmur heard. Respiratory: Effort normal and breath sounds normal. No respiratory distress. He has no wheezes.  GI: Soft. Bowel sounds are normal. There is tenderness. There is guarding.  Moderate to severe right  lower quadrant tenderness with guarding  Musculoskeletal: Normal range of motion. He exhibits no edema or tenderness.  Lymphadenopathy:    He has no cervical adenopathy.  Neurological: He is alert and oriented to person, place, and time.  Skin: Skin is warm and dry. No rash noted. He is not diaphoretic. No erythema.  Psychiatric: His behavior is normal. Judgment normal.     Assessment/Plan Acute appendicitis  I discussed the diagnosis with the patient and his family. Appendectomy is recommended. I discussed the laparoscopic approach. I discussed the risk which includes but is not limited to bleeding, infection, injury to surrounding structures, need for further surgery, conversion to an open procedure, postoperative recovery, etc. He understands and wishes to proceed with surgery which will be scheduled. IV antibiotics have  been given  Harl Bowie, MD 08/26/2015, 8:28 AM

## 2015-08-26 NOTE — ED Notes (Signed)
Reports eating lunch yesterday around 1345 then started having abdominal pain around 3 pm all over belly but worse in right lower quad.  Reports nausea.  Vomited X 1 last night.

## 2015-08-26 NOTE — Discharge Instructions (Signed)
Laparoscopic Appendectomy, Adult, Care After °Refer to this sheet in the next few weeks. These instructions provide you with information on caring for yourself after your procedure. Your caregiver may also give you more specific instructions. Your treatment has been planned according to current medical practices, but problems sometimes occur. Call your caregiver if you have any problems or questions after your procedure. °HOME CARE INSTRUCTIONS °· Do not drive while taking narcotic pain medicines. °· Use stool softener if you become constipated from your pain medicines. °· Change your bandages (dressings) as directed. °· Keep your wounds clean and dry. You may wash the wounds gently with soap and water. Gently pat the wounds dry with a clean towel. °· Do not take baths, swim, or use hot tubs for 10 days, or as instructed by your caregiver. °· Only take over-the-counter or prescription medicines for pain, discomfort, or fever as directed by your caregiver. °· You may continue your normal diet as directed. °· Do not lift more than 10 pounds (4.5 kg) or play contact sports for 3 weeks, or as directed. °· Slowly increase your activity after surgery. °· Take deep breaths to avoid getting a lung infection (pneumonia). °SEEK MEDICAL CARE IF: °· You have redness, swelling, or increasing pain in your wounds. °· You have pus coming from your wounds. °· You have drainage from a wound that lasts longer than 1 day. °· You notice a bad smell coming from the wounds or dressing. °· Your wound edges break open after stitches (sutures) have been removed. °· You notice increasing pain in the shoulders (shoulder strap areas) or near your shoulder blades. °· You develop dizzy episodes or fainting while standing. °· You develop shortness of breath. °· You develop persistent nausea or vomiting. °· You cannot control your bowel functions or lose your appetite. °· You develop diarrhea. °SEEK IMMEDIATE MEDICAL CARE IF:  °· You have a  fever. °· You develop a rash. °· You have difficulty breathing or sharp pains in your chest. °· You develop any reaction or side effects to medicines given. °MAKE SURE YOU: °· Understand these instructions. °· Will watch your condition. °· Will get help right away if you are not doing well or get worse. °  °This information is not intended to replace advice given to you by your health care provider. Make sure you discuss any questions you have with your health care provider. °  °Document Released: 02/05/2005 Document Revised: 06/22/2014 Document Reviewed: 07/26/2014 °Elsevier Interactive Patient Education ©2016 [employer] ° °CCS ______[facility] °LAPAROSCOPIC SURGERY: POST OP INSTRUCTIONS °Always review your discharge instruction sheet given to you by the facility where your surgery was performed. °IF YOU HAVE DISABILITY OR FAMILY LEAVE FORMS, YOU MUST BRING THEM TO THE OFFICE FOR PROCESSING.   °DO NOT GIVE THEM TO YOUR DOCTOR. ° °1. A prescription for pain medication may be given to you upon discharge.  Take your pain medication as prescribed, if needed.  If narcotic pain medicine is not needed, then you may take acetaminophen (Tylenol) or ibuprofen (Advil) as needed. °2. Take your usually prescribed medications unless otherwise directed. °3. If you need a refill on your pain medication, please contact your pharmacy.  They will contact our office to request authorization. Prescriptions will not be filled after 5pm or on week-ends. °4. You should follow a light diet the first few days after arrival home, such as soup and crackers, etc.  Be sure to include lots of fluids daily. °5. Most   patients will experience some swelling and bruising in the area of the incisions.  Ice packs will help.  Swelling and bruising can take several days to resolve.  °6. It is common to experience some constipation if taking pain medication after surgery.  Increasing fluid intake and taking a stool softener (such as  Colace) will usually help or prevent this problem from occurring.  A mild laxative (Milk of Magnesia or Miralax) should be taken according to package instructions if there are no bowel movements after 48 hours. °7. Unless discharge instructions indicate otherwise, you may remove your bandages 24-48 hours after surgery, and you may shower at that time.  You may have steri-strips (small skin tapes) in place directly over the incision.  These strips should be left on the skin for 7-10 days.  If your surgeon used skin glue on the incision, you may shower in 24 hours.  The glue will flake off over the next 2-3 weeks.  Any sutures or staples will be removed at the office during your follow-up visit. °8. ACTIVITIES:  You may resume regular (light) daily activities beginning the next day--such as daily self-care, walking, climbing stairs--gradually increasing activities as tolerated.  You may have sexual intercourse when it is comfortable.  Refrain from any heavy lifting or straining until approved by your doctor. °a. You may drive when you are no longer taking prescription pain medication, you can comfortably wear a seatbelt, and you can safely maneuver your car and apply brakes. °b. RETURN TO WORK:  __________________________________________________________ °9. You should see your doctor in the office for a follow-up appointment approximately 2-3 weeks after your surgery.  Make sure that you call for this appointment within a day or two after you arrive home to insure a convenient appointment time. °10. OTHER INSTRUCTIONS: __________________________________________________________________________________________________________________________ __________________________________________________________________________________________________________________________ °WHEN TO CALL YOUR DOCTOR: °1. Fever over 101.0 °2. Inability to urinate °3. Continued bleeding from incision. °4. Increased pain, redness, or drainage from the  incision. °5. Increasing abdominal pain ° °The clinic staff is available to answer your questions during regular business hours.  Please don’t hesitate to call and ask to speak to one of the nurses for clinical concerns.  If you have a medical emergency, go to the nearest emergency room or call 911.  A surgeon from [facility] is always on call at the hospital. °[address], [address], [place], [place]  [address] ? [address] [address], , [place]   [address] °[phone] ? [phone] ? [phone] [phone] °Web site: [email or web address] ° °

## 2015-08-26 NOTE — Transfer of Care (Signed)
Immediate Anesthesia Transfer of Care Note  Patient: Samuel Wong  Procedure(s) Performed: Procedure(s): APPENDECTOMY LAPAROSCOPIC (N/A)  Patient Location: PACU  Anesthesia Type:General  Level of Consciousness: awake, alert , oriented and patient cooperative  Airway & Oxygen Therapy: Patient Spontanous Breathing and Patient connected to nasal cannula oxygen  Post-op Assessment: Report given to RN and Post -op Vital signs reviewed and stable  Post vital signs: Reviewed and stable  Last Vitals:  Filed Vitals:   08/26/15 1300 08/26/15 1301  BP:  120/57  Pulse: 107 97  Temp: 38.9 C   Resp: 29 27    Last Pain:  Filed Vitals:   08/26/15 1308  PainSc: 10-Worst pain ever         Complications: No apparent anesthesia complications

## 2015-08-26 NOTE — Progress Notes (Signed)
Pt. Transferred from ED around 1040. Administered pain and nausea medication. Received call that OR sending for pt. Report called to Short Stay.

## 2015-08-26 NOTE — Anesthesia Procedure Notes (Signed)
Procedure Name: Intubation Date/Time: 08/26/2015 12:09 PM Performed by: Clearnce Sorrel Pre-anesthesia Checklist: Patient identified, Emergency Drugs available, Suction available, Patient being monitored and Timeout performed Patient Re-evaluated:Patient Re-evaluated prior to inductionOxygen Delivery Method: Circle system utilized Preoxygenation: Pre-oxygenation with 100% oxygen Intubation Type: IV induction Ventilation: Mask ventilation without difficulty Laryngoscope Size: Mac and 3 Grade View: Grade II Tube type: Oral Tube size: 7.5 mm Number of attempts: 1 Airway Equipment and Method: Stylet Placement Confirmation: ETT inserted through vocal cords under direct vision,  positive ETCO2 and breath sounds checked- equal and bilateral Secured at: 23 cm Tube secured with: Tape Dental Injury: Teeth and Oropharynx as per pre-operative assessment

## 2015-08-27 ENCOUNTER — Encounter: Payer: Self-pay | Admitting: General Surgery

## 2015-08-27 ENCOUNTER — Encounter (HOSPITAL_COMMUNITY): Payer: Self-pay

## 2015-08-27 MED ORDER — ACETAMINOPHEN 325 MG PO TABS: 650.0000 mg | ORAL_TABLET | Freq: Four times a day (QID) | ORAL | Status: DC | PRN

## 2015-08-27 MED ORDER — OXYCODONE-ACETAMINOPHEN 5-325 MG PO TABS
1.0000 | ORAL_TABLET | ORAL | Status: DC | PRN
Start: 2015-08-27 — End: 2019-11-18

## 2015-08-27 MED ORDER — IBUPROFEN 200 MG PO TABS: ORAL_TABLET | ORAL | Status: DC

## 2015-08-27 NOTE — Anesthesia Postprocedure Evaluation (Signed)
Anesthesia Post Note  Patient: Samuel Wong  Procedure(s) Performed: Procedure(s) (LRB): APPENDECTOMY LAPAROSCOPIC (N/A)  Patient location during evaluation: PACU Anesthesia Type: General Level of consciousness: awake Pain management: pain level controlled Vital Signs Assessment: post-procedure vital signs reviewed and stable Respiratory status: spontaneous breathing Cardiovascular status: stable Postop Assessment: no signs of nausea or vomiting Anesthetic complications: no     Last Vitals:  Filed Vitals:   08/26/15 2020 08/27/15 0550  BP: 105/63 100/65  Pulse: 98 65  Temp: 37.1 C 36.9 C  Resp: 19 18    Last Pain:  Filed Vitals:   08/27/15 0938  PainSc: 5    Pain Goal:                 [name]

## 2015-08-27 NOTE — Progress Notes (Addendum)
Discharge instructions gone over with patient. Home medications discussed. Prescription given. Work note given. Diet, activity, and incisional care discussed. Signs and symptoms of infection and non-use of antibiotic ointment on incisional sites was discussed.  Reasons to call the doctor gone over. Patient verbalized understanding of instructions.

## 2015-08-27 NOTE — Progress Notes (Signed)
1 Day Post-Op  Subjective: Looks great walking and taking diet without issue.  Objective: Vital signs in last 24 hours: Temp:  [98.4 F (36.9 C)-102 F (38.9 C)] 98.4 F (36.9 C) (07/08 0550) Pulse Rate:  [65-109] 65 (07/08 0550) Resp:  [16-29] 18 (07/08 0550) BP: (100-122)/(57-74) 100/65 mmHg (07/08 0550) SpO2:  [91 %-97 %] 97 % (07/08 0550) Weight:  [89.631 kg (197 lb 9.6 oz)] 89.631 kg (197 lb 9.6 oz) (07/07 1016)  240 PO 500 urine Afebrile, VSS VSS Intake/Output from previous day: 07/07 0701 - 07/08 0700 In: 1540 [P.O.:240; I.V.:1300] Out: 530 [Urine:500; Blood:30] Intake/Output this shift:    General appearance: alert, cooperative and no distress Resp: clear to auscultation bilaterally GI: soft, sore, sites look fine.  Lab Results:   Recent Labs  08/26/15 0633  WBC 20.1*  HGB 14.9  HCT 40.8  PLT 239    BMET  Recent Labs  08/26/15 0633  NA 136  K 3.5  CL 103  CO2 23  GLUCOSE 135*  BUN 14  CREATININE 0.98  CALCIUM 9.2   PT/INR No results for input(s): LABPROT, INR in the last 72 hours.   Recent Labs Lab 08/26/15 0633  AST 25  ALT 26  ALKPHOS 48  BILITOT 1.5*  PROT 6.8  ALBUMIN 4.0     Lipase     Component Value Date/Time   LIPASE 22 08/26/2015 [id number]     Studies/Results: Ct Abdomen Pelvis W Contrast  08/26/2015  CLINICAL DATA:  Diffuse abdominal pain, onset 3 p.m. yesterday. Now with right lower quadrant pain. EXAM: CT ABDOMEN AND PELVIS WITH CONTRAST TECHNIQUE: Multidetector CT imaging of the abdomen and pelvis was performed using the standard protocol following bolus administration of intravenous contrast. CONTRAST:  100 cc Isovue-300 COMPARISON:  None. FINDINGS: Lower chest:  No acute findings. Hepatobiliary: No masses or other significant abnormality. Pancreas: No mass, inflammatory changes, or other significant abnormality. Spleen: Within normal limits in size and appearance. Small hypodense lesion within the posterior spleen is too  small to characterize but most likely a benign cyst or hemangioma. Adrenals/Urinary Tract: No masses identified. No evidence of hydronephrosis. Stomach/Bowel: Appendix is distended to approximately 14 mm. Walls of the appendix are thickened and there is periappendiceal fluid/inflammation. Multiple appendicoliths are identified, including a 6 mm appendicolith at the base of the appendix. No large or small bowel dilatation.  Stomach appears normal. Vascular/Lymphatic: No pathologically enlarged lymph nodes. No evidence of abdominal aortic aneurysm. Reproductive: No mass or other significant abnormality. Other: No abscess collections seen.  No free intraperitoneal air. Musculoskeletal: No acute or suspicious osseous finding. Superficial soft tissues are unremarkable. IMPRESSION: 1. Acute appendicitis. Appendix distended to approximately 14 mm. Walls of the appendix are thickened and there is associated periappendiceal inflammation/fluid stranding. Multiple appendicoliths, including a 6 mm appendicolith at the base of the appendix. No abscess collection. No free intraperitoneal air. 2. Remainder of the abdomen and pelvis CT is unremarkable. These results were called by telephone at the time of interpretation on 08/26/2015 at 7:56 am to Dr. Jeannett Senior , who verbally acknowledged these results. Electronically Signed   By: Franki Cabot M.D.   On: 08/26/2015 08:00    Medications:    Assessment/Plan Acute appendicitis S/p laparoscopic appendectomy, 08/26/15, Dr. Ninfa Linden FEN:  Regular diet ID: Pre op only DVT:  SCD  Plan:  Discharge home today.         Earnstine Regal 08/27/2015 [phone]

## 2015-08-29 ENCOUNTER — Encounter (HOSPITAL_COMMUNITY): Payer: Self-pay | Admitting: Surgery

## 2015-08-29 NOTE — Discharge Summary (Signed)
Physician Discharge Summary  Patient ID: Samuel Wong MRN: [id number] DOB/AGE: 10/24/1970 45 y.o.  Admit date: 08/26/2015 Discharge date: 7//2017  Admission Diagnoses:  Acute appendicitis   Discharge Diagnoses:  Same   Active Problems:   Acute appendicitis   PROCEDURES: laparoscopic appendectomy 08/27/15, Dr. Bill Salinas Course:  This is a 45 year old gentleman who presents with right lower quadrant abdominal pain. He reports the pain is sharp and radiating all over his abdomen. The pain started yesterday. He has had one episode of nausea and vomiting. He denies fevers. Bowel movements a been normal. He is otherwise healthy without complaints. The pain is described as moderate to severe. He was seen in the ED by Dr. Ninfa Linden and admitted.  He was taken to the OR later that day.  Post op he did well and was discharged home the following AM.    Condition on d/c:  Improved    CBC Latest Ref Rng 08/26/2015 09/22/2014  WBC 4.0 - 10.5 K/uL 20.1(H) 6.6  Hemoglobin 13.0 - 17.0 g/dL 14.9 15.8  Hematocrit 39.0 - 52.0 % 40.8 45.6  Platelets 150 - 400 K/uL 239 279.0   CMP Latest Ref Rng 08/26/2015 09/22/2014  Glucose 65 - 99 mg/dL 135(H) 108(H)  BUN 6 - 20 mg/dL 14 14  Creatinine 0.61 - 1.24 mg/dL 0.98 0.88  Sodium 135 - 145 mmol/L 136 140  Potassium 3.5 - 5.1 mmol/L 3.5 4.7  Chloride 101 - 111 mmol/L 103 103  CO2 22 - 32 mmol/L 23 32  Calcium 8.9 - 10.3 mg/dL 9.2 9.8  Total Protein 6.5 - 8.1 g/dL 6.8 7.5  Total Bilirubin 0.3 - 1.2 mg/dL 1.5(H) 0.7  Alkaline Phos 38 - 126 U/L 48 48  AST 15 - 41 U/L 25 26  ALT 17 - 63 U/L 26 35    Disposition: 01-Home or Self Care     Medication List    TAKE these medications        acetaminophen 325 MG tablet  Commonly known as:  TYLENOL  Take 2 tablets (650 mg total) by mouth every 6 (six) hours as needed.     FISH OIL PO  Take 2 capsules by mouth daily after lunch.     GINKGO BILOBA PO  Take 1 tablet by mouth daily after  lunch. Supplement for memory     ibuprofen 200 MG tablet  Commonly known as:  MOTRIN IB  You can take 2-3 every 6 hours as needed for pain.  I would use this first then the Percocet.     multivitamin with minerals Tabs tablet  Take 1 tablet by mouth daily after lunch.     OVER THE COUNTER MEDICATION  Take 1 tablet by mouth daily after lunch. Prostate supplement     OVER THE COUNTER MEDICATION  Take 2 capsules by mouth daily after lunch. [employer] "green" supplement     oxyCODONE-acetaminophen 5-325 MG tablet  Commonly known as:  PERCOCET/ROXICET  Take 1-2 tablets by mouth every 4 (four) hours as needed for moderate pain.     PEPTO-BISMOL PO  Take 30 mLs by mouth every 30 (thirty) minutes as needed (indigestion/ stomach pain).           Follow-up Information    Follow up with [facility] On 09/14/2015.   Specialty:  General Surgery   Why:  Your appointment is at 10:30 AM, be at the office 30 minutes early for check in.     Contact information:  [address] [place] [address] [phone]       Signed: Earnstine Regal 08/29/2015, 10:14 AM

## 2017-07-03 NOTE — Telephone Encounter (Signed)
Chart opened in error

## 2019-07-14 ENCOUNTER — Other Ambulatory Visit: Payer: Self-pay

## 2019-07-14 ENCOUNTER — Encounter: Payer: Self-pay | Admitting: Sports Medicine

## 2019-07-14 ENCOUNTER — Ambulatory Visit (INDEPENDENT_AMBULATORY_CARE_PROVIDER_SITE_OTHER): Payer: 59 | Admitting: Sports Medicine

## 2019-07-14 VITALS — BP 106/60 | Ht 73.0 in | Wt 200.0 lb

## 2019-07-14 DIAGNOSIS — G8929 Other chronic pain: Secondary | ICD-10-CM

## 2019-07-14 DIAGNOSIS — M545 Low back pain, unspecified: Secondary | ICD-10-CM

## 2019-07-14 DIAGNOSIS — M25521 Pain in right elbow: Secondary | ICD-10-CM | POA: Diagnosis not present

## 2019-07-14 NOTE — Progress Notes (Signed)
[facility] [address] [place], [place] [address] Phone: [phone] Fax: [phone]   Patient Name: Samuel Wong Date of Birth: 22-Oct-1970 Medical Record Number: [id number] Gender: male Date of Encounter: 07/14/2019  SUBJECTIVE:      Chief Complaint:  Acute low back and right elbow pain   HPI:  Samuel Wong is a 49 year old RHD M presenting with 1 week of acute low back pain on the left side after lifting a heavy box.  He describes it as pretty debilitating and falling to the floor, slowly having to get up.  Over the last week each day the pain has improved.  He denies any loss of bowel or bladder function.  No radiating symptoms.  No weakness in his legs.  Has never injured his back before. Samuel Wong has had about 1 year of right elbow pain when he is doing push-ups or lifting weights.  He describes his elbow popping when he was a teenager and then reinjuring caring his newborn a few years ago.  He has pain when he fully extends his elbow.  He is unable to do push-ups without having pain at the back of his elbow.  He did play football growing up.  Otherwise, he has never injured this elbow before.     ROS:     See HPI.   PERTINENT  PMH / PSH / FH / SH:  Past Medical, Surgical, Social, and Family History Reviewed & Updated in the EMR. Pertinent findings include:  Psoriasis   OBJECTIVE:  Ht 6\' 1"  (1.854 m)   Wt 200 lb (90.7 kg)   BMI 26.39 kg/m  Physical Exam:  Vital signs are reviewed.   GEN: Alert and oriented, NAD Pulm: Breathing unlabored PSY: normal mood, congruent affect  MSK: Back Exam:  Inspection: Unremarkable  Motion: Flexion 45 deg, Extension 45 deg, Side Bending to 45 deg bilaterally,  Rotation to 45 deg bilaterally  SLR laying: Negative  XSLR laying: Negative  Palpable tenderness: Left lumbar paraspinal musculature FABER: negative. Sensory change: Gross sensation intact to all lumbar and sacral dermatomes.  Reflexes: 2+ at  both patellar tendons, 2+ at achilles tendons, Babinski's downgoing.  Strength at Feet  Plantar-flexion: 5/5 Dorsi-flexion: 5/5 Eversion: 5/5 Inversion: 5/5  Leg strength  Quad: 5/5 Hamstring: 5/5 Hip flexor: 5/5 Hip abductors: 5/5  Gait unremarkable.  Right Elbow: Unremarkable to inspection. Range of motion full pronation, supination, flexion Pain with active full extension Strength is full to all of the above directions Stable to varus, valgus stress. Negative moving valgus stress test. No discrete areas of tenderness to palpation. Ulnar nerve does not sublux. Negative cubital tunnel Tinel's.  Limited MSK ultrasound Right elbow The posterior elbow joint was visualized in long and short axis without effusion.  There was a small hyperechoic calcification on the radial side at lateral elbow joint without fracture or effusion.  Extensor musculature visualized in long and short axis without abnormality.  Impression: Right elbow calcification   ASSESSMENT & PLAN:   1. Acute low back pain  Back pain is mechanical in nature, likely there are no red flags.  We have given patient home exercise program to focus on core strengthening.  He can use over-the-counter anti-inflammatory as needed.  Follow-up on an as-needed basis.  2.  Chronic right elbow pain  Patient likely had prior injury that has caused deposition into the joint space that is causing mechanical locking with full extension.  Recommended he avoid exercises that fully extend his elbow  and to use pain as his guide.  He can consider an elbow compression sleeve for comfort if needed.  We will plan to see him back on an as-needed basis for this.   Lanier Clam, DO, ATC Sports Medicine Fellow  I observed and examined the patient with Dr. Kathrynn Speed and agree with assessment and plan.  Note reviewed and modified by me. Ila Mcgill, MD

## 2019-11-18 ENCOUNTER — Encounter: Payer: Self-pay | Admitting: Internal Medicine

## 2019-11-18 ENCOUNTER — Ambulatory Visit: Payer: 59 | Admitting: Internal Medicine

## 2019-11-18 ENCOUNTER — Other Ambulatory Visit: Payer: Self-pay

## 2019-11-18 VITALS — BP 118/78 | HR 71 | Temp 98.6°F | Resp 16 | Ht 73.0 in | Wt 208.0 lb

## 2019-11-18 DIAGNOSIS — L409 Psoriasis, unspecified: Secondary | ICD-10-CM | POA: Diagnosis not present

## 2019-11-18 DIAGNOSIS — Z0000 Encounter for general adult medical examination without abnormal findings: Secondary | ICD-10-CM

## 2019-11-18 DIAGNOSIS — Z1159 Encounter for screening for other viral diseases: Secondary | ICD-10-CM

## 2019-11-18 DIAGNOSIS — Z1211 Encounter for screening for malignant neoplasm of colon: Secondary | ICD-10-CM

## 2019-11-18 NOTE — Progress Notes (Signed)
Subjective:  Patient ID: Estelle June, male    DOB: 1970/09/09  Age: 49 y.o. MRN: [id number]  CC: Annual Exam and Rash  This visit occurred during the SARS-CoV-2 public health emergency.  Safety protocols were in place, including screening questions prior to the visit, additional usage of staff PPE, and extensive cleaning of exam room while observing appropriate contact time as indicated for disinfecting solutions.    HPI Graham Hyun presents for a CPX.  He has a history of psoriasis and would like to start seeing a dermatologist to have this treated. He is very active and denies any recent episodes of chest pain, shortness of breath, palpitations, edema, or fatigue.  History Stokes has a past medical history of Psoriasis.   He has a past surgical history that includes Eye surgery; Wisdom tooth extraction; and laparoscopic appendectomy (N/A, 08/26/2015).   His family history includes Arthritis in his mother; Heart disease in his paternal grandfather.He reports that he quit smoking about 21 years ago. He smoked 10.00 packs per day. He has never used smokeless tobacco. He reports current alcohol use of about 19.0 standard drinks of alcohol per week. He reports that he does not use drugs.  Outpatient Medications Prior to Visit  Medication Sig Dispense Refill  . Multiple Vitamin (MULTIVITAMIN WITH MINERALS) TABS tablet Take 1 tablet by mouth daily after lunch.    Marland Kitchen acetaminophen (TYLENOL) 325 MG tablet Take 2 tablets (650 mg total) by mouth every 6 (six) hours as needed.    . Bismuth Subsalicylate (PEPTO-BISMOL PO) Take 30 mLs by mouth every 30 (thirty) minutes as needed (indigestion/ stomach pain).    Marland Kitchen GINKGO BILOBA PO Take 1 tablet by mouth daily after lunch. Supplement for memory    . ibuprofen (MOTRIN IB) 200 MG tablet You can take 2-3 every 6 hours as needed for pain.  I would use this first then the Percocet. 100 tablet 2  . Omega-3 Fatty Acids (FISH OIL PO) Take 2 capsules by  mouth daily after lunch.    Marland Kitchen OVER THE COUNTER MEDICATION Take 1 tablet by mouth daily after lunch. Prostate supplement    . OVER THE COUNTER MEDICATION Take 2 capsules by mouth daily after lunch. [employer] "green" supplement    . oxyCODONE-acetaminophen (PERCOCET/ROXICET) 5-325 MG tablet Take 1-2 tablets by mouth every 4 (four) hours as needed for moderate pain. 30 tablet 0   No facility-administered medications prior to visit.    ROS Review of Systems  Constitutional: Negative.  Negative for appetite change, diaphoresis, fatigue and unexpected weight change.  HENT: Negative.   Eyes: Negative.   Respiratory: Negative for cough, chest tightness, shortness of breath and wheezing.   Cardiovascular: Negative for chest pain, palpitations and leg swelling.  Gastrointestinal: Negative for abdominal pain, constipation, diarrhea, nausea and vomiting.  Endocrine: Negative.   Genitourinary: Negative.  Negative for difficulty urinating, scrotal swelling, testicular pain and urgency.  Musculoskeletal: Negative for arthralgias and myalgias.  Skin: Positive for rash. Negative for color change.  Neurological: Negative.  Negative for dizziness, weakness and light-headedness.  Hematological: Negative for adenopathy. Does not bruise/bleed easily.  Psychiatric/Behavioral: Negative.     Objective:  BP 118/78   Pulse 71   Temp 98.6 F (37 C) (Oral)   Resp 16   Ht 6\' 1"  (1.854 m)   Wt 208 lb (94.3 kg)   SpO2 97%   BMI 27.44 kg/m   Physical Exam Constitutional:      Appearance: Normal appearance.  HENT:  Nose: Nose normal.     Mouth/Throat:     Mouth: Mucous membranes are moist.     Pharynx: No oropharyngeal exudate.  Eyes:     General: No scleral icterus.    Conjunctiva/sclera: Conjunctivae normal.     Pupils: Pupils are equal, round, and reactive to light.  Cardiovascular:     Rate and Rhythm: Normal rate and regular rhythm.     Heart sounds: No murmur heard.   Pulmonary:      Effort: Pulmonary effort is normal.     Breath sounds: No stridor. No wheezing, rhonchi or rales.  Abdominal:     General: Abdomen is flat.     Palpations: There is no mass.     Tenderness: There is no abdominal tenderness. There is no guarding.     Hernia: There is no hernia in the right inguinal area.  Genitourinary:    Pubic Area: No rash.      Penis: Normal and circumcised. No discharge, swelling or lesions.      Testes: Normal.        Right: Mass, tenderness or swelling not present.        Left: Mass, tenderness or swelling not present.     Epididymis:     Right: Normal. Not inflamed or enlarged. No mass.     Left: Not inflamed or enlarged. No mass.  Musculoskeletal:        General: No swelling. Normal range of motion.     Cervical back: Neck supple.     Right lower leg: No edema.     Left lower leg: No edema.  Lymphadenopathy:     Cervical: No cervical adenopathy.     Lower Body: No right inguinal adenopathy. No left inguinal adenopathy.  Skin:    General: Skin is warm.     Findings: Rash present. Rash is scaling.     Comments: Plaques over both elbows and BLE.  Neurological:     General: No focal deficit present.     Mental Status: He is alert and oriented to person, place, and time. Mental status is at baseline.     Lab Results  Component Value Date   WBC 6.7 11/18/2019   HGB 15.3 11/18/2019   HCT 43.7 11/18/2019   PLT 293 11/18/2019   GLUCOSE 107 (H) 11/18/2019   CHOL 179 11/18/2019   TRIG 77 11/18/2019   HDL 53 11/18/2019   LDLCALC 109 (H) 11/18/2019   ALT 28 11/18/2019   AST 22 11/18/2019   NA 139 11/18/2019   K 3.9 11/18/2019   CL 102 11/18/2019   CREATININE 1.07 11/18/2019   BUN 18 11/18/2019   CO2 27 11/18/2019   TSH 3.74 09/22/2014   PSA 0.72 11/18/2019    Assessment & Plan:   Dimarco was seen today for annual exam and rash.  Diagnoses and all orders for this visit:  Routine general medical examination at a health care facility- Exam  completed, labs reviewed, vaccines reviewed - He deferred on a flu vaccine, will screen for colon cancer/polyps with Cologuard, patient education material was given. -     Lipid panel; Future -     PSA; Future -     HIV Antibody (routine testing w rflx); Future -     HIV Antibody (routine testing w rflx) -     PSA -     Lipid panel  Psoriasis- His labs are reassuring.  He will see a dermatologist about this. -  CBC with Differential/Platelet; Future -     BASIC METABOLIC PANEL WITH GFR; Future -     Hepatic function panel; Future -     Ambulatory referral to Dermatology -     Hepatic function panel -     BASIC METABOLIC PANEL WITH GFR -     CBC with Differential/Platelet  Colon cancer screening -     Cologuard  Need for hepatitis C screening test -     Hepatitis C antibody; Future -     Hepatitis C antibody   I have discontinued Rodrigus Gamero's Omega-3 Fatty Acids (FISH OIL PO), OVER THE COUNTER MEDICATION, OVER THE COUNTER MEDICATION, GINKGO BILOBA PO, Bismuth Subsalicylate (PEPTO-BISMOL PO), oxyCODONE-acetaminophen, acetaminophen, and ibuprofen. I am also having him maintain his multivitamin with minerals.  No orders of the defined types were placed in this encounter.    Follow-up: Return if symptoms worsen or fail to improve.  Scarlette Calico, MD

## 2019-11-18 NOTE — Patient Instructions (Signed)

## 2019-11-19 LAB — CBC WITH DIFFERENTIAL/PLATELET
Absolute Monocytes: 637 cells/uL (ref 200–950)
Basophils Absolute: 60 cells/uL (ref 0–200)
Basophils Relative: 0.9 %
Eosinophils Absolute: 302 cells/uL (ref 15–500)
Eosinophils Relative: 4.5 %
HCT: 43.7 % (ref 38.5–50.0)
Hemoglobin: 15.3 g/dL (ref 13.2–17.1)
Lymphs Abs: 1541 cells/uL (ref 850–3900)
MCH: 31.5 pg (ref 27.0–33.0)
MCHC: 35 g/dL (ref 32.0–36.0)
MCV: 90.1 fL (ref 80.0–100.0)
MPV: 9.1 fL (ref 7.5–12.5)
Monocytes Relative: 9.5 %
Neutro Abs: 4161 cells/uL (ref 1500–7800)
Neutrophils Relative %: 62.1 %
Platelets: 293 10*3/uL (ref 140–400)
RBC: 4.85 10*6/uL (ref 4.20–5.80)
RDW: 12.4 % (ref 11.0–15.0)
Total Lymphocyte: 23 %
WBC: 6.7 10*3/uL (ref 3.8–10.8)

## 2019-11-19 LAB — LIPID PANEL
Cholesterol: 179 mg/dL (ref <200)
HDL: 53 mg/dL (ref >=40)
LDL Cholesterol (Calc): 109 mg/dL (calc) — ABNORMAL HIGH
Non-HDL Cholesterol (Calc): 126 mg/dL (calc) (ref <130)
Total CHOL/HDL Ratio: 3.4 (calc) (ref <5.0)
Triglycerides: 77 mg/dL (ref <150)

## 2019-11-19 LAB — HEPATIC FUNCTION PANEL
AG Ratio: 2.1 (calc) (ref 1.0–2.5)
ALT: 28 U/L (ref 9–46)
AST: 22 U/L (ref 10–35)
Albumin: 4.7 g/dL (ref 3.6–5.1)
Alkaline phosphatase (APISO): 51 U/L (ref 35–144)
Bilirubin, Direct: 0.2 mg/dL (ref 0.0–0.2)
Globulin: 2.2 g/dL (calc) (ref 1.9–3.7)
Indirect Bilirubin: 0.5 mg/dL (calc) (ref 0.2–1.2)
Total Bilirubin: 0.7 mg/dL (ref 0.2–1.2)
Total Protein: 6.9 g/dL (ref 6.1–8.1)

## 2019-11-19 LAB — BASIC METABOLIC PANEL WITH GFR
BUN: 18 mg/dL (ref 7–25)
CO2: 27 mmol/L (ref 20–32)
Calcium: 9.2 mg/dL (ref 8.6–10.3)
Chloride: 102 mmol/L (ref 98–110)
Creat: 1.07 mg/dL (ref 0.70–1.33)
GFR, Est African American: 93 mL/min/{1.73_m2} (ref >=60)
GFR, Est Non African American: 81 mL/min/{1.73_m2} (ref >=60)
Glucose, Bld: 107 mg/dL — ABNORMAL HIGH (ref 65–99)
Potassium: 3.9 mmol/L (ref 3.5–5.3)
Sodium: 139 mmol/L (ref 135–146)

## 2019-11-19 LAB — PSA: PSA: 0.72 ng/mL (ref <=4.0)

## 2019-11-19 LAB — HEPATITIS C ANTIBODY
Hepatitis C Ab: NONREACTIVE
SIGNAL TO CUT-OFF: 0.01 (ref <1.00)

## 2019-11-19 LAB — HIV ANTIBODY (ROUTINE TESTING W REFLEX): HIV 1&2 Ab, 4th Generation: NONREACTIVE

## 2019-11-22 ENCOUNTER — Encounter: Payer: Self-pay | Admitting: Internal Medicine

## 2019-12-16 ENCOUNTER — Encounter: Payer: Self-pay | Admitting: Internal Medicine

## 2019-12-16 LAB — COLOGUARD: Cologuard: NEGATIVE

## 2019-12-25 ENCOUNTER — Encounter: Payer: Self-pay | Admitting: Internal Medicine

## 2019-12-25 LAB — COLOGUARD: Cologuard: NEGATIVE

## 2020-02-22 ENCOUNTER — Ambulatory Visit: Payer: 59 | Admitting: Family

## 2020-02-22 DIAGNOSIS — Z0289 Encounter for other administrative examinations: Secondary | ICD-10-CM

## 2020-03-10 ENCOUNTER — Ambulatory Visit: Payer: 59 | Admitting: Internal Medicine

## 2020-03-10 ENCOUNTER — Other Ambulatory Visit: Payer: Self-pay

## 2020-03-10 ENCOUNTER — Encounter: Payer: Self-pay | Admitting: Internal Medicine

## 2020-03-10 VITALS — BP 110/64 | HR 63 | Temp 98.3°F | Resp 16 | Ht 73.0 in | Wt 218.0 lb

## 2020-03-10 DIAGNOSIS — B001 Herpesviral vesicular dermatitis: Secondary | ICD-10-CM | POA: Insufficient documentation

## 2020-03-10 MED ORDER — VALACYCLOVIR HCL 1 G PO TABS: 2000.0000 mg | ORAL_TABLET | Freq: Two times a day (BID) | ORAL | 2 refills | Status: DC

## 2020-03-10 NOTE — Patient Instructions (Signed)
Cold Sore  A cold sore, also called a fever blister, is a small, fluid-filled sore that forms inside the mouth or on the lips, gums, nose, chin, or cheeks. Cold sores can spread to other parts of the body, such as the eyes or fingers. In some people who have other medical conditions, cold sores can spread to multiple other body sites, including the genitals. Cold sores can spread from person to person (are contagious) until the sores crust over completely. Most cold sores go away within 2 weeks. What are the causes? Cold sores are caused by an infection from a common type of herpes simplex virus (HSV-1). HSV-1 is closely related to the HSV-2virus, which is the virus that causes genital herpes, but these viruses are not the same. Once a person is infected with HSV-1, the virus remains permanently in the body. HSV-1 is spread from person to person through close contact, such as through kissing, touching the affected area, or sharing personal items such as lip balm, razors, a drinking glass, or eating utensils. What increases the risk? You are more likely to develop this condition if you:  Are tired, stressed, or sick.  Are menstruating.  Are pregnant.  Take certain medicines.  Are exposed to cold weather or too much sun. What are the signs or symptoms? Symptoms of a cold sore outbreak go through different stages. These are the stages of a cold sore:  Tingling, itching, or burning is felt 1-2 days before the outbreak.  Fluid-filled blisters appear on the lips, inside the mouth, on the nose, or on the cheeks.  The blisters start to ooze clear fluid.  The blisters dry up, and a yellow crust appears in their place.  The crust falls off. In some cases, other symptoms can develop during a cold sore outbreak. These can include:  Fever.  Sore throat.  Headache.  Muscle aches.  Swollen neck glands. How is this diagnosed? This condition is diagnosed based on your medical history and a  physical exam. Your health care provider may do a blood test or may swab some fluid from your sore and then examine the swab in the lab. How is this treated? There is no cure for cold sores or HSV-1. There is also no vaccine for HSV-1. Most cold sores go away on their own without treatment within 2 weeks. Medicines cannot make the infection go away, but your health care provider may prescribe medicines to:  Help relieve some of the pain associated with the sores.  Work to stop the virus from multiplying.  Shorten healing time. Medicines may be in the form of creams, gels, pills, or a shot. Follow these instructions at home: Medicines  Take or apply over-the-counter and prescription medicines only as told by your health care provider.  Use a cotton-tip swab to apply creams or gels to your sores.  Ask your health care provider if you can take lysine supplements. Research has found that lysine may help heal the cold sore faster and prevent outbreaks. Sore care  Do not touch the sores or pick the scabs.  Wash your hands often. Do not touch your eyes without washing your hands first.  Keep the sores clean and dry.  If directed, apply ice to the sores: ? Put ice in a plastic bag. ? Place a towel between your skin and the bag. ? Leave the ice on for 20 minutes, 2-3 times a day.   Eating and drinking  Eat a soft, bland diet. Avoid  eating hot, cold, or salty foods.  Use a straw if it hurts to drink out of a glass.  Eat foods that are rich in lysine, such as meat, fish, and dairy products.  Avoid sugary foods, chocolates, nuts, and grains. These foods are rich in a nutrient called arginine, which can cause the virus to multiply. Lifestyle  Do not kiss, have oral sex, or share personal items until your sores heal.  Stress, poor sleep, and being out in the sun can trigger outbreaks. Make sure you: ? Do activities that help you relax, such as deep breathing exercises or  meditation. ? Get enough sleep. ? Apply sunscreen on your lips before you go out in the sun. Contact a health care provider if:  You have symptoms for more than 2 weeks.  You have pus coming from the sores.  You have redness that is spreading.  You have pain or irritation in your eye.  You get sores on your genitals.  Your sores do not heal within 2 weeks.  You have frequent cold sore outbreaks. Get help right away if you have:  A fever and your symptoms suddenly get worse.  A headache and confusion.  Fatigue or loss of appetite.  A stiff neck or sensitivity to light. Summary  A cold sore, also called a fever blister, is a small, fluid-filled sore that forms inside the mouth or on the lips, gums, nose, chin, or cheeks.  Most cold sores go away on their own without treatment within 2 weeks. Your health care provider may prescribe medicines to help relieve some of the pain, work to stop the virus from multiplying, and shorten healing time.  Wash your hands often. Do not touch your eyes without washing your hands first.  Do not kiss, have oral sex, or share personal items until your sores heal.  Contact a health care provider if your sores do not heal within 2 weeks. This information is not intended to replace advice given to you by your health care provider. Make sure you discuss any questions you have with your health care provider. Document Revised: 05/28/2018 Document Reviewed: 07/08/2017 Elsevier Patient Education  2021 [employer].

## 2020-03-10 NOTE — Progress Notes (Signed)
Subjective:  Patient ID: Samuel Wong, male    DOB: 1970-05-31  Age: 50 y.o. MRN: [id number]  CC: Rash  This visit occurred during the SARS-CoV-2 public health emergency.  Safety protocols were in place, including screening questions prior to the visit, additional usage of staff PPE, and extensive cleaning of exam room while observing appropriate contact time as indicated for disinfecting solutions.    HPI Samuel Wong presents for f/up - He complains of a 3-day history of painful blisters over his left lower lip.  He has had some lymphadenopathy in the left jaw associated with this.  Outpatient Medications Prior to Visit  Medication Sig Dispense Refill  . Multiple Vitamin (MULTIVITAMIN WITH MINERALS) TABS tablet Take 1 tablet by mouth daily after lunch.     No facility-administered medications prior to visit.    ROS Review of Systems  Constitutional: Negative.  Negative for chills, diaphoresis, fatigue and fever.  HENT: Positive for mouth sores. Negative for trouble swallowing.   Eyes: Negative.   Respiratory: Negative for cough, chest tightness, shortness of breath and wheezing.   Cardiovascular: Negative for chest pain, palpitations and leg swelling.  Gastrointestinal: Negative for abdominal pain, diarrhea, nausea and vomiting.  Endocrine: Negative.   Genitourinary: Negative.  Negative for difficulty urinating and dysuria.  Musculoskeletal: Negative for arthralgias.  Skin: Negative.  Negative for rash.  Neurological: Negative.  Negative for dizziness, weakness, light-headedness and headaches.  Hematological: Positive for adenopathy. Does not bruise/bleed easily.  Psychiatric/Behavioral: Negative.     Objective:  BP 110/64 (BP Location: Left Arm, Patient Position: Sitting, Cuff Size: Normal)   Pulse 63   Temp 98.3 F (36.8 C) (Oral)   Resp 16   Ht 6\' 1"  (1.854 m)   Wt 218 lb (98.9 kg)   SpO2 98%   BMI 28.76 kg/m   BP Readings from Last 3 Encounters:   03/10/20 110/64  11/18/19 118/78  07/14/19 106/60    Wt Readings from Last 3 Encounters:  03/10/20 218 lb (98.9 kg)  11/18/19 208 lb (94.3 kg)  07/14/19 200 lb (90.7 kg)    Physical Exam Vitals reviewed.  Constitutional:      Appearance: Normal appearance.  HENT:     Nose: Nose normal.     Mouth/Throat:     Mouth: Mucous membranes are moist.   Eyes:     General: No scleral icterus.    Conjunctiva/sclera: Conjunctivae normal.  Neck:     Thyroid: No thyroid mass.  Cardiovascular:     Rate and Rhythm: Normal rate and regular rhythm.     Heart sounds: No murmur heard.   Pulmonary:     Effort: Pulmonary effort is normal.     Breath sounds: No stridor. No wheezing, rhonchi or rales.  Abdominal:     General: Abdomen is flat.     Palpations: There is no mass.     Tenderness: There is no abdominal tenderness. There is no guarding.  Musculoskeletal:        General: Normal range of motion.     Cervical back: Neck supple.     Right lower leg: No edema.     Left lower leg: No edema.  Lymphadenopathy:     Head:     Right side of head: No submental, submandibular, preauricular, posterior auricular or occipital adenopathy.     Left side of head: Submandibular adenopathy present. No submental, preauricular, posterior auricular or occipital adenopathy.     Cervical: No cervical adenopathy.  Skin:  General: Skin is warm and dry.  Neurological:     General: No focal deficit present.     Mental Status: He is alert.     Lab Results  Component Value Date   WBC 6.7 11/18/2019   HGB 15.3 11/18/2019   HCT 43.7 11/18/2019   PLT 293 11/18/2019   GLUCOSE 107 (H) 11/18/2019   CHOL 179 11/18/2019   TRIG 77 11/18/2019   HDL 53 11/18/2019   LDLCALC 109 (H) 11/18/2019   ALT 28 11/18/2019   AST 22 11/18/2019   NA 139 11/18/2019   K 3.9 11/18/2019   CL 102 11/18/2019   CREATININE 1.07 11/18/2019   BUN 18 11/18/2019   CO2 27 11/18/2019   TSH 3.74 09/22/2014   PSA 0.72  11/18/2019    CT Abdomen Pelvis W Contrast  Result Date: 08/26/2015 CLINICAL DATA:  Diffuse abdominal pain, onset 3 p.m. yesterday. Now with right lower quadrant pain. EXAM: CT ABDOMEN AND PELVIS WITH CONTRAST TECHNIQUE: Multidetector CT imaging of the abdomen and pelvis was performed using the standard protocol following bolus administration of intravenous contrast. CONTRAST:  100 cc Isovue-300 COMPARISON:  None. FINDINGS: Lower chest:  No acute findings. Hepatobiliary: No masses or other significant abnormality. Pancreas: No mass, inflammatory changes, or other significant abnormality. Spleen: Within normal limits in size and appearance. Small hypodense lesion within the posterior spleen is too small to characterize but most likely a benign cyst or hemangioma. Adrenals/Urinary Tract: No masses identified. No evidence of hydronephrosis. Stomach/Bowel: Appendix is distended to approximately 14 mm. Walls of the appendix are thickened and there is periappendiceal fluid/inflammation. Multiple appendicoliths are identified, including a 6 mm appendicolith at the base of the appendix. No large or small bowel dilatation.  Stomach appears normal. Vascular/Lymphatic: No pathologically enlarged lymph nodes. No evidence of abdominal aortic aneurysm. Reproductive: No mass or other significant abnormality. Other: No abscess collections seen.  No free intraperitoneal air. Musculoskeletal: No acute or suspicious osseous finding. Superficial soft tissues are unremarkable. IMPRESSION: 1. Acute appendicitis. Appendix distended to approximately 14 mm. Walls of the appendix are thickened and there is associated periappendiceal inflammation/fluid stranding. Multiple appendicoliths, including a 6 mm appendicolith at the base of the appendix. No abscess collection. No free intraperitoneal air. 2. Remainder of the abdomen and pelvis CT is unremarkable. These results were called by telephone at the time of interpretation on 08/26/2015 at  7:56 am to Dr. Jeannett Senior , who verbally acknowledged these results. Electronically Signed   By: Franki Cabot M.D.   On: 08/26/2015 08:00    Assessment & Plan:   [name] was seen today for rash.  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  Will treat with high-dose valacyclovir over the last next 24 hours. -     valACYclovir (VALTREX) 1000 MG tablet; Take 2 tablets (2,000 mg total) by mouth 2 (two) times daily.   I am having Samuel Wong start on valACYclovir. I am also having him maintain his multivitamin with minerals.  Meds ordered this encounter  Medications  . valACYclovir (VALTREX) 1000 MG tablet    Sig: Take 2 tablets (2,000 mg total) by mouth 2 (two) times daily.    Dispense:  4 tablet    Refill:  2     Follow-up: Return in about 3 weeks (around 03/31/2020).  Scarlette Calico, MD

## 2020-03-12 ENCOUNTER — Telehealth (INDEPENDENT_AMBULATORY_CARE_PROVIDER_SITE_OTHER): Payer: 59 | Admitting: Nurse Practitioner

## 2020-03-12 ENCOUNTER — Encounter: Payer: Self-pay | Admitting: Internal Medicine

## 2020-03-12 ENCOUNTER — Encounter: Payer: Self-pay | Admitting: Nurse Practitioner

## 2020-03-12 DIAGNOSIS — B001 Herpesviral vesicular dermatitis: Secondary | ICD-10-CM

## 2020-03-12 MED ORDER — VALACYCLOVIR HCL 1 G PO TABS: 1000.0000 mg | ORAL_TABLET | Freq: Two times a day (BID) | ORAL | 0 refills | Status: AC

## 2020-03-12 NOTE — Patient Instructions (Signed)
Start valtrex again Call office if any signs of cellulitis.   Cold Sore  A cold sore, also called a fever blister, is a small, fluid-filled sore that forms inside the mouth or on the lips, gums, nose, chin, or cheeks. Cold sores can spread to other parts of the body, such as the eyes or fingers. In some people who have other medical conditions, cold sores can spread to multiple other body sites, including the genitals. Cold sores can spread from person to person (are contagious) until the sores crust over completely. Most cold sores go away within 2 weeks. What are the causes? Cold sores are caused by an infection from a common type of herpes simplex virus (HSV-1). HSV-1 is closely related to the HSV-2virus, which is the virus that causes genital herpes, but these viruses are not the same. Once a person is infected with HSV-1, the virus remains permanently in the body. HSV-1 is spread from person to person through close contact, such as through kissing, touching the affected area, or sharing personal items such as lip balm, razors, a drinking glass, or eating utensils. What increases the risk? You are more likely to develop this condition if you:  Are tired, stressed, or sick.  Are menstruating.  Are pregnant.  Take certain medicines.  Are exposed to cold weather or too much sun. What are the signs or symptoms? Symptoms of a cold sore outbreak go through different stages. These are the stages of a cold sore:  Tingling, itching, or burning is felt 1-2 days before the outbreak.  Fluid-filled blisters appear on the lips, inside the mouth, on the nose, or on the cheeks.  The blisters start to ooze clear fluid.  The blisters dry up, and a yellow crust appears in their place.  The crust falls off. In some cases, other symptoms can develop during a cold sore outbreak. These can include:  Fever.  Sore throat.  Headache.  Muscle aches.  Swollen neck glands. How is this  diagnosed? This condition is diagnosed based on your medical history and a physical exam. Your health care provider may do a blood test or may swab some fluid from your sore and then examine the swab in the lab. How is this treated? There is no cure for cold sores or HSV-1. There is also no vaccine for HSV-1. Most cold sores go away on their own without treatment within 2 weeks. Medicines cannot make the infection go away, but your health care provider may prescribe medicines to:  Help relieve some of the pain associated with the sores.  Work to stop the virus from multiplying.  Shorten healing time. Medicines may be in the form of creams, gels, pills, or a shot. Follow these instructions at home: Medicines  Take or apply over-the-counter and prescription medicines only as told by your health care provider.  Use a cotton-tip swab to apply creams or gels to your sores.  Ask your health care provider if you can take lysine supplements. Research has found that lysine may help heal the cold sore faster and prevent outbreaks. Sore care  Do not touch the sores or pick the scabs.  Wash your hands often. Do not touch your eyes without washing your hands first.  Keep the sores clean and dry.  If directed, apply ice to the sores: ? Put ice in a plastic bag. ? Place a towel between your skin and the bag. ? Leave the ice on for 20 minutes, 2-3 times a day.  Eating and drinking  Eat a soft, bland diet. Avoid eating hot, cold, or salty foods.  Use a straw if it hurts to drink out of a glass.  Eat foods that are rich in lysine, such as meat, fish, and dairy products.  Avoid sugary foods, chocolates, nuts, and grains. These foods are rich in a nutrient called arginine, which can cause the virus to multiply. Lifestyle  Do not kiss, have oral sex, or share personal items until your sores heal.  Stress, poor sleep, and being out in the sun can trigger outbreaks. Make sure you: ? Do  activities that help you relax, such as deep breathing exercises or meditation. ? Get enough sleep. ? Apply sunscreen on your lips before you go out in the sun. Contact a health care provider if:  You have symptoms for more than 2 weeks.  You have pus coming from the sores.  You have redness that is spreading.  You have pain or irritation in your eye.  You get sores on your genitals.  Your sores do not heal within 2 weeks.  You have frequent cold sore outbreaks. Get help right away if you have:  A fever and your symptoms suddenly get worse.  A headache and confusion.  Fatigue or loss of appetite.  A stiff neck or sensitivity to light. Summary  A cold sore, also called a fever blister, is a small, fluid-filled sore that forms inside the mouth or on the lips, gums, nose, chin, or cheeks.  Most cold sores go away on their own without treatment within 2 weeks. Your health care provider may prescribe medicines to help relieve some of the pain, work to stop the virus from multiplying, and shorten healing time.  Wash your hands often. Do not touch your eyes without washing your hands first.  Do not kiss, have oral sex, or share personal items until your sores heal.  Contact a health care provider if your sores do not heal within 2 weeks. This information is not intended to replace advice given to you by your health care provider. Make sure you discuss any questions you have with your health care provider. Document Revised: 05/28/2018 Document Reviewed: 07/08/2017 Elsevier Patient Education  2021 [employer].

## 2020-03-12 NOTE — Progress Notes (Signed)
Virtual Visit via Video Note  I connected with@ on 03/12/20 at 11:30 AM EST by a video enabled telemedicine application and verified that I am speaking with the correct person using two identifiers.  Location: Patient:Home Provider: Office Participants: patient and provider  I discussed the limitations of evaluation and management by telemedicine and the availability of in person appointments. I also discussed with the patient that there may be a patient responsible charge related to this service. The patient expressed understanding and agreed to proceed.  CC:Pt c/o cold sores on his mouth, pt states he started to notice the cold sore forming 2 weeks ago but it progressively got worse and he went to his PCP who prescribed valtrex and since taking it there has been some slight improvement but he feels like he needs another Rx.  65yrs old son COVID positive, he had 2 negative COVID test, had 3-COVID vaccine doses  History of Present Illness: Samuel Wong present with persistent left jaw tenderness and swelling, left lower lip ulcer and headache, onset 2weeks ago, some improvement with valtrex 2000mg  BID x 1day. Denies any fever or pain with chewing or ear pain or tinnitus or swollen lymph nodes or neck pain or sore throat.   Observations/Objective: Physical Exam Vitals reviewed.  HENT:     Head:     Jaw: Tenderness and swelling present. No trismus, pain on movement or malocclusion.     Salivary Glands: Right salivary gland is not diffusely enlarged. Left salivary gland is not diffusely enlarged.     Right Ear: External ear normal.     Left Ear: External ear normal.     Mouth/Throat:     Mouth: No oral lesions.     Tongue: No lesions. Tongue does not deviate from midline.     Pharynx: No oropharyngeal exudate or posterior oropharyngeal erythema.      Comments: Equal and symmetrical facial muscle movement. Pulmonary:     Effort: Pulmonary effort is normal.  Musculoskeletal:      Cervical back: Normal range of motion and neck supple.  Skin:    Findings: Lesion present. No erythema.  Neurological:     Mental Status: He is alert and oriented to person, place, and time.    Assessment and Plan: Samuel Wong was seen today for acute visit.  Diagnoses and all orders for this visit:  Herpes labialis without complication -     valACYclovir (VALTREX) 1000 MG tablet; Take 1 tablet (1,000 mg total) by mouth 2 (two) times daily.   Follow Up Instructions: See above Call office if any signs of cellulitis   I discussed the assessment and treatment plan with the patient. The patient was provided an opportunity to ask questions and all were answered. The patient agreed with the plan and demonstrated an understanding of the instructions.   The patient was advised to call back or seek an in-person evaluation if the symptoms worsen or if the condition fails to improve as anticipated.  Samuel Lacy, NP

## 2020-03-14 ENCOUNTER — Telehealth: Payer: Self-pay | Admitting: Internal Medicine

## 2020-03-14 ENCOUNTER — Other Ambulatory Visit: Payer: Self-pay | Admitting: Internal Medicine

## 2020-03-14 DIAGNOSIS — L03211 Cellulitis of face: Secondary | ICD-10-CM | POA: Insufficient documentation

## 2020-03-14 DIAGNOSIS — B001 Herpesviral vesicular dermatitis: Secondary | ICD-10-CM

## 2020-03-14 MED ORDER — AMOXICILLIN-POT CLAVULANATE 875-125 MG PO TABS: 1.0000 | ORAL_TABLET | Freq: Two times a day (BID) | ORAL | 0 refills | Status: AC

## 2020-03-14 NOTE — Telephone Encounter (Signed)
[facility] FYI  Caller states he is on valcyclavir, had cold sore outbreak, given refills, face is still swollen, inside of mouth sore, and wants to take another dose. Client Directives: # Do not call for Meds afterhours  [facility] advised: See PCP within 24 hours  Patient understood and had a virtual visit on 1.22.2022.

## 2020-04-26 ENCOUNTER — Other Ambulatory Visit: Payer: Self-pay

## 2020-04-26 ENCOUNTER — Encounter: Payer: Self-pay | Admitting: Dermatology

## 2020-04-26 ENCOUNTER — Other Ambulatory Visit: Payer: Self-pay | Admitting: Dermatology

## 2020-04-26 ENCOUNTER — Ambulatory Visit (INDEPENDENT_AMBULATORY_CARE_PROVIDER_SITE_OTHER): Payer: 59 | Admitting: Dermatology

## 2020-04-26 DIAGNOSIS — Z1283 Encounter for screening for malignant neoplasm of skin: Secondary | ICD-10-CM

## 2020-04-26 DIAGNOSIS — D2362 Other benign neoplasm of skin of left upper limb, including shoulder: Secondary | ICD-10-CM | POA: Diagnosis not present

## 2020-04-26 DIAGNOSIS — D239 Other benign neoplasm of skin, unspecified: Secondary | ICD-10-CM

## 2020-04-26 DIAGNOSIS — D1801 Hemangioma of skin and subcutaneous tissue: Secondary | ICD-10-CM

## 2020-04-26 DIAGNOSIS — L918 Other hypertrophic disorders of the skin: Secondary | ICD-10-CM

## 2020-04-26 DIAGNOSIS — D2239 Melanocytic nevi of other parts of face: Secondary | ICD-10-CM | POA: Diagnosis not present

## 2020-04-26 DIAGNOSIS — L821 Other seborrheic keratosis: Secondary | ICD-10-CM

## 2020-04-26 DIAGNOSIS — D485 Neoplasm of uncertain behavior of skin: Secondary | ICD-10-CM

## 2020-04-26 DIAGNOSIS — L409 Psoriasis, unspecified: Secondary | ICD-10-CM

## 2020-04-26 MED ORDER — HALOBETASOL PROPIONATE 0.05 % EX CREA: TOPICAL_CREAM | Freq: Two times a day (BID) | CUTANEOUS | 2 refills | Status: AC

## 2020-04-26 NOTE — Patient Instructions (Signed)

## 2020-05-15 ENCOUNTER — Encounter: Payer: Self-pay | Admitting: Dermatology

## 2020-05-15 NOTE — Progress Notes (Signed)
    New Patient   Subjective  Samuel Wong is a 50 y.o. male who presents for the following: New Patient (Initial Visit) (Patient here today for skin check. No concerns).  General skin examination and discussed treatment options for psoriasis. Location:  Duration:  Quality:  Associated Signs/Symptoms: Modifying Factors:  Severity:  Timing: Context:    The following portions of the chart were reviewed this encounter and updated as appropriate:  Tobacco  Allergies  Meds  Problems  Med Hx  Surg Hx  Fam Hx      Objective  Well appearing patient in no apparent distress; mood and affect are within normal limits. Objective  Chest - [facility]): General skin examination, no atypical pigmented lesions.  Objective  Right Forearm - Anterior: Firm 5 mm pink dermal papule  Objective  Right Abdomen (side) - Upper: ABDOMEN MANY 1 to 2 mm smooth red papules  Objective  Right Upper Eyelid: RIGHT EYELID, NECK, UNDER ARMS   Objective  Mid Back: MANY ON BACK: 3 to 8 mm brown textured flattopped papules  Objective  Left Knee - Anterior, Left Lower Leg - Posterior, Right Inguinal Area, Right Knee - Anterior, Right Lower Leg - Posterior: Small to medium plaque psoriasis, LOWER LEGS AND LEFT BACK, differential diagnosis for the left back would be superficial carcinoma so if the topical therapy fails he may return for biopsy.  Objective  Right Forehead: Pearly 5 mm papule with central dell.  5.2 CM TO OUTER BROW      A full examination was performed including scalp, head, eyes, ears, nose, lips, neck, chest, axillae, abdomen, back, buttocks, bilateral upper extremities, bilateral lower extremities, hands, feet, fingers, toes, fingernails, and toenails. All findings within normal limits unless otherwise noted below.   Assessment & Plan  Screening exam for skin cancer Chest - Medial [facility])  Annual skin examination, encouraged to self examine with spouse twice  annually, continued ultraviolet protection.  Dermatofibroma Right Forearm - Anterior  Recheck as needed change  Cherry angioma Right Abdomen (side) - Upper  Intervention not necessary  Skin tag Right Upper Eyelid  Seborrheic keratosis Mid Back  Intervention not currently necessary  Psoriasis (5) Left Knee - Anterior; Right Knee - Anterior; Left Lower Leg - Posterior; Right Lower Leg - Posterior; Right Inguinal Area  Topical halobetasol daily after bathing for 1 month.  Avoid use on face and the body folds.  Neoplasm of uncertain behavior of skin Right Forehead  Skin / nail biopsy Type of biopsy: tangential   Informed consent: discussed and consent obtained   Timeout: patient name, date of birth, surgical site, and procedure verified   Anesthesia: the lesion was anesthetized in a standard fashion   Anesthetic:  1% lidocaine w/ epinephrine 1-100,000 local infiltration Instrument used: flexible razor blade   Hemostasis achieved with: aluminum chloride and electrodesiccation   Outcome: patient tolerated procedure well   Post-procedure details: wound care instructions given    Specimen 1 - Surgical pathology Differential Diagnosis: BCC SCC  Check Margins: No     I, Lavonna Monarch, MD, have reviewed all documentation for this visit.  The documentation on 05/15/20 for the exam, diagnosis, procedures, and orders are all accurate and complete.

## 2020-07-06 ENCOUNTER — Ambulatory Visit: Payer: 59 | Admitting: Dermatology

## 2020-07-19 ENCOUNTER — Ambulatory Visit (INDEPENDENT_AMBULATORY_CARE_PROVIDER_SITE_OTHER): Payer: 59 | Admitting: Internal Medicine

## 2020-07-19 ENCOUNTER — Other Ambulatory Visit: Payer: Self-pay

## 2020-07-19 ENCOUNTER — Encounter: Payer: Self-pay | Admitting: Internal Medicine

## 2020-07-19 DIAGNOSIS — K649 Unspecified hemorrhoids: Secondary | ICD-10-CM | POA: Insufficient documentation

## 2020-07-19 MED ORDER — HYDROCORTISONE 2.5 % EX CREA: TOPICAL_CREAM | Freq: Three times a day (TID) | CUTANEOUS | 3 refills | Status: DC

## 2020-07-19 NOTE — Assessment & Plan Note (Signed)
Moderate pain and external hemorrhoids present. Rx anusol cream to use TID for 1-2 weeks. If no improvement let us know.

## 2020-07-19 NOTE — Patient Instructions (Addendum)
You can take the baths to help and we have sent in the strong cream to use three times a day on the outside.

## 2020-07-19 NOTE — Progress Notes (Signed)
   Subjective:   Patient ID: Samuel Wong, male    DOB: [birth year], 50 y.o.   MRN: [id number]  HPI The patient is a 50 YO man coming in for concerns about pain in rectal area. Prior cologuard negative. Had hard stool over the weekend, used laxative and was able to pass this. Since has hurt with BM. Feels more like pressure, does not feel like knives or needles. Does have some blood with passing stool since. Warm bath has helped. Has not tried anything else. About the same maybe minimal improvement.    Review of Systems  Constitutional: Negative.   HENT: Negative.   Eyes: Negative.   Respiratory: Negative for cough, chest tightness and shortness of breath.   Cardiovascular: Negative for chest pain, palpitations and leg swelling.  Gastrointestinal: Positive for anal bleeding and rectal pain. Negative for abdominal distention, abdominal pain, constipation, diarrhea, nausea and vomiting.  Musculoskeletal: Negative.   Skin: Negative.   Neurological: Negative.   Psychiatric/Behavioral: Negative.     Objective:  Physical Exam Constitutional:      Appearance: He is well-developed.  HENT:     Head: Normocephalic and atraumatic.  Cardiovascular:     Rate and Rhythm: Normal rate and regular rhythm.  Pulmonary:     Effort: Pulmonary effort is normal. No respiratory distress.     Breath sounds: Normal breath sounds. No wheezing or rales.  Abdominal:     General: Bowel sounds are normal. There is no distension.     Palpations: Abdomen is soft.     Tenderness: There is no abdominal tenderness. There is no rebound.  Genitourinary:    Comments: Several external hemorrhoids present on exam Musculoskeletal:     Cervical back: Normal range of motion.  Skin:    General: Skin is warm and dry.  Neurological:     Mental Status: He is alert and oriented to person, place, and time.     Coordination: Coordination normal.     Vitals:   07/19/20 0940  BP: 116/82  Pulse: 81  Resp: 18  Temp:  98.2 F (36.8 C)  TempSrc: Oral  SpO2: 97%  Weight: 205 lb 12.8 oz (93.4 kg)  Height: 6\' 1"  (1.854 m)    This visit occurred during the SARS-CoV-2 public health emergency.  Safety protocols were in place, including screening questions prior to the visit, additional usage of staff PPE, and extensive cleaning of exam room while observing appropriate contact time as indicated for disinfecting solutions.   Assessment & Plan:

## 2020-07-29 ENCOUNTER — Telehealth: Payer: Self-pay | Admitting: Internal Medicine

## 2020-07-29 NOTE — Telephone Encounter (Signed)
    Patient seeking advice for painful  hemorrhoids

## 2020-08-01 NOTE — Telephone Encounter (Signed)
I do not do this procedure so more appropriate to schedule with his PCP thanks.

## 2020-08-01 NOTE — Telephone Encounter (Signed)
Pt has already made a follow up OV for tomorrow with Dr. Sharlet Salina.

## 2020-08-01 NOTE — Telephone Encounter (Signed)
I have called pt to move OV to PCP. He stated that he is doing better and no longer needs the OV and wished to cancel.   I have cancelled the OV.

## 2020-08-02 ENCOUNTER — Ambulatory Visit: Payer: 59 | Admitting: Internal Medicine

## 2021-03-27 NOTE — Progress Notes (Deleted)
° ° °  Subjective:    CC: L elbow pain  I, Lorance Pickeral, LAT, ATC, am serving as scribe for Dr. Lynne Leader.  HPI: Pt is a 51 y/o male presenting w/ c/o L elbow pain x .  He locates his pain to .  L elbow swelling: Aggravating factors: Treatments tried:  Pertinent review of Systems: ***  Relevant historical information: ***   Objective:   There were no vitals filed for this visit. General: Well Developed, well nourished, and in no acute distress.   MSK: ***  Lab and Radiology Results No results found for this or any previous visit (from the past 72 hour(s)). No results found.    Impression and Recommendations:    Assessment and Plan: 51 y.o. male with ***.  PDMP not reviewed this encounter. No orders of the defined types were placed in this encounter.  No orders of the defined types were placed in this encounter.   Discussed warning signs or symptoms. Please see discharge instructions. Patient expresses understanding.   ***

## 2021-03-28 ENCOUNTER — Ambulatory Visit: Payer: Self-pay | Admitting: Family Medicine

## 2021-03-29 NOTE — Progress Notes (Signed)
° ° °  Subjective:    CC: B elbow pain, L>R  I, Molly Weber, LAT, ATC, am serving as scribe for Dr. Lynne Leader.  HPI: Pt is a 51 y/o male presenting w/ c/o B elbow pain, L>R.  He reports chronic issues w/ his L elbow but notes increased L elbow pain since last Sat, Mar 25, 2021.  He locates his pain to his L olecranon. 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  He notes his pain worsened after he crawled under a home last week. He also wonders about tendinitis.  He has done well in the past with home exercises for elbow tendinitis and wonders if that would help with this problem.  His symptoms improved after taking ibuprofen but are still present in the left elbow.  L elbow swelling: yes at the L olecranon Aggravating factors: L elbow flexion AROM; pressure to the area Treatments tried: ice; IBU  Pertinent review of Systems: No fevers or chills  Relevant historical information: Psoriasis both elbows.   Objective:    Vitals:   03/30/21 0818  BP: 110/76  Pulse: 61  SpO2: 99%   General: Well Developed, well nourished, and in no acute distress.   MSK: Left elbow: Skin overlying olecranon prominence scaling without erythema consistent in appearance with mild psoriasis. Overlying the elbow area mild swelling. Mildly tender to palpation. Normal elbow motion. Intact strength to extension and flexion. Mild pain with resisted elbow extension. Normal range of motion. No pain with resisted wrist extension or flexion.  Lab and Radiology Results  Diagnostic Limited MSK Ultrasound of: Left elbow olecranon Soft tissue overlying olecranon prominence area of hypoechoic fluid collection consistent with mild olecranon bursitis. Bony structures are normal-appearing Impression: Olecranon bursitis    Impression and Recommendations:    Assessment and Plan: 52 y.o. male with elbow swelling consistent with olecranon bursitis.  This is very likely due to his  excessive elbow pressure with his job as a Animator where he crawls under houses. He may have a little bit of triceps tendinitis but this is certainly a lesser issue. I am doubtful that the psoriasis is playing a role here but that certainly is a factor that we should keep in mind for the future.  Plan to treat with compression and Voltaren gel and padding.  Recommend body helix compression sleeve and appropriately thick elbow pads with crawling activity.  Additionally home exercise program taught in clinic today for triceps tendinitis by ATC.  Recheck in a month especially if not improved.  Consider steroid injection if needed.Marland Kitchen  PDMP not reviewed this encounter. Orders Placed This Encounter  Procedures   [place] LIMITED JOINT SPACE STRUCTURES UP LEFT(NO LINKED CHARGES)    Order Specific Question:   Reason for Exam (SYMPTOM  OR DIAGNOSIS REQUIRED)    Answer:   L elbow pain    Order Specific Question:   Preferred imaging location?    Answer:   [place]   No orders of the defined types were placed in this encounter.   Discussed warning signs or symptoms. Please see discharge instructions. Patient expresses understanding.   The above documentation has been reviewed and is accurate and complete Lynne Leader, M.D.

## 2021-03-30 ENCOUNTER — Ambulatory Visit: Payer: Self-pay

## 2021-03-30 ENCOUNTER — Other Ambulatory Visit: Payer: Self-pay

## 2021-03-30 ENCOUNTER — Ambulatory Visit: Payer: Self-pay | Admitting: Nurse Practitioner

## 2021-03-30 ENCOUNTER — Encounter: Payer: Self-pay | Admitting: Family Medicine

## 2021-03-30 ENCOUNTER — Ambulatory Visit: Payer: 59 | Admitting: Family Medicine

## 2021-03-30 VITALS — BP 110/76 | HR 61 | Ht 73.0 in | Wt 200.8 lb

## 2021-03-30 DIAGNOSIS — M25522 Pain in left elbow: Secondary | ICD-10-CM

## 2021-03-30 DIAGNOSIS — M7022 Olecranon bursitis, left elbow: Secondary | ICD-10-CM | POA: Insufficient documentation

## 2021-03-30 NOTE — Patient Instructions (Addendum)
Nice to see you today.  Please perform the exercise program that we have prepared for you and gone over in detail on a daily basis.  In addition to the handout you were provided you can access your program through: [email or web address]   Your unique program code is:  [id number]  Use compression on your elbow such as an elbow compression sleeve or ACE wrap.  I recommend you obtained a compression sleeve to help with your joint problems. There are many options on the market however I recommend obtaining a elbow Body Helix compression sleeve.  You can find information (including how to appropriate measure yourself for sizing) can be found at www.Body http://www.lambert.com/.  Many of these products are health savings account (HSA) eligible.   You can use the compression sleeve at any time throughout the day but is most important to use while being active as well as for 2 hours post-activity.   It is appropriate to ice following activity with the compression sleeve in place.   Please use Voltaren gel (Generic Diclofenac Gel) up to 4x daily for pain as needed.  This is available over-the-counter as both the name brand Voltaren gel and the generic diclofenac gel.   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  Follow-up: one month

## 2021-04-24 ENCOUNTER — Other Ambulatory Visit: Payer: Self-pay

## 2021-04-24 ENCOUNTER — Encounter: Payer: Self-pay | Admitting: Family Medicine

## 2021-04-24 ENCOUNTER — Ambulatory Visit (INDEPENDENT_AMBULATORY_CARE_PROVIDER_SITE_OTHER): Payer: 59

## 2021-04-24 ENCOUNTER — Ambulatory Visit: Payer: Self-pay

## 2021-04-24 ENCOUNTER — Ambulatory Visit: Payer: 59 | Admitting: Family Medicine

## 2021-04-24 VITALS — BP 124/76 | HR 86 | Ht 73.0 in | Wt 200.4 lb

## 2021-04-24 DIAGNOSIS — M25562 Pain in left knee: Secondary | ICD-10-CM

## 2021-04-24 DIAGNOSIS — M7022 Olecranon bursitis, left elbow: Secondary | ICD-10-CM

## 2021-04-24 NOTE — Progress Notes (Signed)
Left knee x-ray shows some mild arthritis changes.

## 2021-04-24 NOTE — Patient Instructions (Addendum)
Good to see you today. ? ?Please use Tylenol, IBU and Voltaren gel. ? ?Please use Voltaren gel (Generic Diclofenac Gel) up to 4x daily for pain as needed.  This is available over-the-counter as both the name brand Voltaren gel and the generic diclofenac gel.  ? ?Please get an Xray today before you leave. ? ?Follow-up: as needed ?

## 2021-04-24 NOTE — Progress Notes (Signed)
? ?  I, Wendy Poet, LAT, ATC, am serving as scribe for Dr. Lynne Leader. ? ?Samuel Wong is a 51 y.o. male who presents to [place] [facility] today for f/u of L olecranon bursitis and new L knee pain.  He was last seen by Dr. Georgina Snell on 03/30/21 and was advised to use a compression sleeve and Voltaren gel.  He was also shown a HEP for triceps tendinitis.  Today, pt reports that his L elbow is feeling better.  He has been wearing his elbow compression sleeve fairly consistently. ? ?L knee pain:  Pain x one week and thinks he might have "twisted" it while doing a home inspection.  He locates his pain to his L medial knee. ?-Swelling: slight  ?-Mechanical symptoms: no ?-Aggravating factors: squatting; walking ?-Treatments tried: IBU, ice;  ? ? ?Pertinent review of systems: No fevers or chills ? ?Relevant historical information: Psoriasis. ? ? ?Exam:  ?BP 124/76 (BP Location: Left Arm, Patient Position: Sitting, Cuff Size: Normal)   Pulse 86   Ht '6\' 1"'$  (1.854 m)   Wt 200 lb 6.4 oz (90.9 kg)   SpO2 99%   BMI 26.44 kg/m?  ?General: Well Developed, well nourished, and in no acute distress.  ? ?MSK: Left elbow: Trace swelling at olecranon bursa.  Nontender.  Normal elbow motion ? ?Left knee: Psoriasis anterior knee. ?Normal knee motion. ?Tender palpation medial joint line. ?Stable ligamentous exam. ?Positive McMurray's test. ?Intact strength. ? ? ? ?Lab and Radiology Results ? ? ?Diagnostic Limited MSK Ultrasound of: Left knee ?Quad tendon intact normal-appearing ?Trace joint effusion. ?Patellar tendon normal. ?Medial joint line normal-appearing without visible meniscus tear. ?Lateral joint line normal-appearing ?Impression: Mild joint effusion.  No definitive medial meniscus tear visible. ? ?X-ray images left knee obtained today personally and independently interpreted ?Mild patellofemoral DJD.  No acute fractures. ?Await formal radiology review ? ? ? ?Assessment and Plan: ?51 y.o. male with  left olecranon bursitis.  Improved with compression and padding.  Continue compression and padding and recheck back as needed. ? ? ?Left knee pain thought to be due to degenerative medial meniscus fraying or tear.  Plan for Voltaren gel and oral NSAIDs quad strengthening and watchful waiting.  If needed consider steroid injection or even MRI in the future. ? ?Recheck back as needed. ? ? ?PDMP not reviewed this encounter. ?Orders Placed This Encounter  ?Procedures  ? [place] LIMITED JOINT SPACE STRUCTURES LOW LEFT(NO LINKED CHARGES)  ?  Order Specific Question:   Reason for Exam (SYMPTOM  OR DIAGNOSIS REQUIRED)  ?  Answer:   L knee ice  ?  Order Specific Question:   Preferred imaging location?  ?  Answer:   [place]  ? DG Knee AP/LAT W/Sunrise Left  ?  Standing Status:   Future  ?  Number of Occurrences:   1  ?  Standing Expiration Date:   05/25/2021  ?  Order Specific Question:   Reason for Exam (SYMPTOM  OR DIAGNOSIS REQUIRED)  ?  Answer:   L knee pain  ?  Order Specific Question:   Preferred imaging location?  ?  Answer:   [facility]  ? ?No orders of the defined types were placed in this encounter. ? ? ? ?Discussed warning signs or symptoms. Please see discharge instructions. Patient expresses understanding. ? ? ?The above documentation has been reviewed and is accurate and complete Lynne Leader, M.D. ? ? ?

## 2021-04-26 ENCOUNTER — Other Ambulatory Visit: Payer: Self-pay

## 2021-04-26 ENCOUNTER — Ambulatory Visit: Payer: 59 | Admitting: Dermatology

## 2021-04-26 DIAGNOSIS — L918 Other hypertrophic disorders of the skin: Secondary | ICD-10-CM

## 2021-04-26 DIAGNOSIS — L821 Other seborrheic keratosis: Secondary | ICD-10-CM | POA: Diagnosis not present

## 2021-04-26 DIAGNOSIS — D239 Other benign neoplasm of skin, unspecified: Secondary | ICD-10-CM

## 2021-04-26 DIAGNOSIS — D2361 Other benign neoplasm of skin of right upper limb, including shoulder: Secondary | ICD-10-CM | POA: Diagnosis not present

## 2021-04-26 DIAGNOSIS — L400 Psoriasis vulgaris: Secondary | ICD-10-CM | POA: Diagnosis not present

## 2021-04-26 DIAGNOSIS — Z1283 Encounter for screening for malignant neoplasm of skin: Secondary | ICD-10-CM

## 2021-04-26 DIAGNOSIS — L738 Other specified follicular disorders: Secondary | ICD-10-CM | POA: Diagnosis not present

## 2021-04-26 MED ORDER — CLOBETASOL PROPIONATE 0.05 % EX FOAM: Freq: Two times a day (BID) | CUTANEOUS | 3 refills | Status: DC

## 2021-04-26 NOTE — Patient Instructions (Signed)
Otc hydrocortisone ointment  ?

## 2021-04-27 ENCOUNTER — Ambulatory Visit: Payer: 59 | Admitting: Family Medicine

## 2021-05-06 ENCOUNTER — Encounter: Payer: Self-pay | Admitting: Dermatology

## 2021-05-06 NOTE — Progress Notes (Signed)
? ?  Follow-Up Visit ?  ?Subjective  ?Samuel Wong is a 51 y.o. male who presents for the following: Annual Exam (Pt her for annual. Pt has spot he wants removed on the R temple). ? ?Annual skin examination, several areas of concern ?Location:  ?Duration:  ?Quality:  ?Associated Signs/Symptoms: ?Modifying Factors:  ?Severity:  ?Timing: ?Context:  ? ?Objective  ?Well appearing patient in no apparent distress; mood and affect are within normal limits. ?All skin examination, no atypical pigmented lesions or nonmelanoma skin cancer.  All pigmented spots checked with dermoscopy. ? ?Left Popliteal Fossa, Mid Back ?Light brown textured 5 mm flattopped papules ? ?Right Axilla, Right Supraorbital Region ?Pedunculated 2 mm papules ? ?Left Elbow - Posterior, Right Elbow - Posterior, Rightand Left Posterior Auricle ?Small plaque psoriasis.  No joint involvement.  Multiple options reviewed. ? ?Right Upper Arm - Posterior ?Firm 4 mm pink dermal papule ? ?Head - Anterior (Face) ?Multiple 2 mm flesh-colored papules, some with eccentric umbilication ? ? ? ?A full examination was performed including scalp, head, eyes, ears, nose, lips, neck, chest, axillae, abdomen, back, buttocks, bilateral upper extremities, bilateral lower extremities, hands, feet, fingers, toes, fingernails, and toenails. All findings within normal limits unless otherwise noted below. ? ? ?Assessment & Plan  ? ? ?Encounter for screening for malignant neoplasm of skin ? ?Annual skin examination, encouraged to self examine with spouse twice annually.  Continued ultraviolet protection. ? ?Seborrheic keratosis (2) ?Left Popliteal Fossa; Mid Back ? ?Leave if stable ? ?Skin tag (2) ?Right Axilla; Right Supraorbital Region ? ?Patient may choose future removal ? ?Psoriasis vulgaris ?Left Elbow - Posterior; Right Elbow - Posterior; Rightand Left Posterior Auricle ? ?Will apply clobetasol to affected areas nightly for up to 4 weeks then taper if improved.  Avoid use on  face and body folds. ? ?clobetasol (OLUX) 0.05 % topical foam - Left Elbow - Posterior, Right Elbow - Posterior, Rightand Left Posterior Auricle ?Apply topically 2 (two) times daily. ? ?Dermatofibroma ?Right Upper Arm - Posterior ? ?Leave if stable ? ?Sebaceous gland hyperplasia ?Head - Anterior (Face) ? ?Told of similar appearance of early [place] so if any lesion grows or bleeds return for biopsy. ? ? ? ? ? ?I, Lavonna Monarch, MD, have reviewed all documentation for this visit.  The documentation on 05/06/21 for the exam, diagnosis, procedures, and orders are all accurate and complete. ?

## 2022-01-31 ENCOUNTER — Telehealth: Payer: Self-pay

## 2022-01-31 ENCOUNTER — Other Ambulatory Visit: Payer: Self-pay | Admitting: Emergency Medicine

## 2022-01-31 ENCOUNTER — Encounter: Payer: Self-pay | Admitting: Emergency Medicine

## 2022-01-31 ENCOUNTER — Ambulatory Visit: Payer: 59 | Admitting: Emergency Medicine

## 2022-01-31 VITALS — BP 136/68 | HR 97 | Temp 98.9°F | Ht 73.0 in | Wt 201.0 lb

## 2022-01-31 DIAGNOSIS — B349 Viral infection, unspecified: Secondary | ICD-10-CM | POA: Insufficient documentation

## 2022-01-31 DIAGNOSIS — R5383 Other fatigue: Secondary | ICD-10-CM | POA: Diagnosis not present

## 2022-01-31 DIAGNOSIS — R509 Fever, unspecified: Secondary | ICD-10-CM | POA: Diagnosis not present

## 2022-01-31 LAB — CBC WITH DIFFERENTIAL/PLATELET
Basophils Absolute: 0.1 10*3/uL (ref 0.0–0.1)
Basophils Relative: 0.4 % (ref 0.0–3.0)
Eosinophils Absolute: 0.1 10*3/uL (ref 0.0–0.7)
Eosinophils Relative: 0.3 % (ref 0.0–5.0)
HCT: 39.4 % (ref 39.0–52.0)
Hemoglobin: 13.7 g/dL (ref 13.0–17.0)
Lymphocytes Relative: 6.7 % — ABNORMAL LOW (ref 12.0–46.0)
Lymphs Abs: 1.3 10*3/uL (ref 0.7–4.0)
MCHC: 34.7 g/dL (ref 30.0–36.0)
MCV: 88.2 fl (ref 78.0–100.0)
Monocytes Absolute: 1.7 10*3/uL — ABNORMAL HIGH (ref 0.1–1.0)
Monocytes Relative: 9 % (ref 3.0–12.0)
Neutro Abs: 15.7 10*3/uL — ABNORMAL HIGH (ref 1.4–7.7)
Neutrophils Relative %: 83.6 % — ABNORMAL HIGH (ref 43.0–77.0)
Platelets: 369 10*3/uL (ref 150.0–400.0)
RBC: 4.46 Mil/uL (ref 4.22–5.81)
RDW: 12.7 % (ref 11.5–15.5)
WBC: 18.8 10*3/uL (ref 4.0–10.5)

## 2022-01-31 LAB — COMPREHENSIVE METABOLIC PANEL
ALT: 45 U/L (ref 0–53)
AST: 32 U/L (ref 0–37)
Albumin: 4.3 g/dL (ref 3.5–5.2)
Alkaline Phosphatase: 96 U/L (ref 39–117)
BUN: 14 mg/dL (ref 6–23)
CO2: 30 mEq/L (ref 19–32)
Calcium: 9.2 mg/dL (ref 8.4–10.5)
Chloride: 99 mEq/L (ref 96–112)
Creatinine, Ser: 1.26 mg/dL (ref 0.40–1.50)
GFR: 65.36 mL/min (ref >60.00)
Glucose, Bld: 115 mg/dL — ABNORMAL HIGH (ref 70–99)
Potassium: 3.8 mEq/L (ref 3.5–5.1)
Sodium: 137 mEq/L (ref 135–145)
Total Bilirubin: 0.8 mg/dL (ref 0.2–1.2)
Total Protein: 7.3 g/dL (ref 6.0–8.3)

## 2022-01-31 LAB — TSH: TSH: 3.18 u[IU]/mL (ref 0.35–5.50)

## 2022-01-31 MED ORDER — AZITHROMYCIN 250 MG PO TABS: ORAL_TABLET | ORAL | 0 refills | Status: DC

## 2022-01-31 NOTE — Assessment & Plan Note (Signed)
Running its course without complication. No red flag signs or symptoms. No chronic medical conditions. Blood work done today. Advised to rest and stay well-hydrated Advised to contact the office if clinical picture changes or gets worse during the next several days.

## 2022-01-31 NOTE — Progress Notes (Signed)
Samuel Wong 51 y.o.   Chief Complaint  Patient presents with   Fever    Low grade fever for past 2 weeks , fatigue , mild aching     HISTORY OF PRESENT ILLNESS: [name] complaining of 2-week history of low-grade fever, feeling achy and fatigued, and coughing up "healthy" phlegm. Tested negative for COVID.  No recent traveling.  No one else sick at home. Able to eat and drink.  Denies nausea or vomiting.  Denies abdominal pain or diarrhea.  No other associated symptoms. No other complaints or medical concerns today.  Fever  Associated symptoms include coughing. Pertinent negatives include no abdominal pain, chest pain, congestion, nausea, sore throat, urinary pain or vomiting.     Prior to Admission medications   Medication Sig Start Date End Date Taking? Authorizing Provider  Multiple Vitamin (MULTIVITAMIN WITH MINERALS) TABS tablet Take 1 tablet by mouth daily after lunch.   Yes [provider]  clobetasol (OLUX) 0.05 % topical foam Apply topically 2 (two) times daily. Patient not taking: Reported on 01/31/2022 04/26/21   Lavonna Monarch, MD  hydrocortisone 2.5 % cream Apply topically 3 (three) times daily. Patient not taking: Reported on 04/26/2021 07/19/20   Hoyt Koch, MD  valACYclovir (VALTREX) 1000 MG tablet Take 1 tablet (1,000 mg total) by mouth 2 (two) times daily. Patient not taking: Reported on 01/31/2022 03/12/20   Flossie Buffy, NP    No Known Allergies  Patient Active Problem List   Diagnosis Date Noted   Colon cancer screening 11/18/2019   Psoriasis 09/22/2014   Nonspecific abnormal electrocardiogram (ECG) (EKG) 09/22/2014    Past Medical History:  Diagnosis Date   Psoriasis     Past Surgical History:  Procedure Laterality Date   EYE SURGERY     LAPAROSCOPIC APPENDECTOMY N/A 08/26/2015   Procedure: APPENDECTOMY LAPAROSCOPIC;  Surgeon: Coralie Keens, MD;  Location: [place];  Service: General;  Laterality: N/A;    WISDOM TOOTH EXTRACTION      Social History   Socioeconomic History   Marital status: Married    Spouse name: Not on file   Number of children: Not on file   Years of education: Not on file   Highest education level: Not on file  Occupational History   Not on file  Tobacco Use   Smoking status: Former    Packs/day: 10.00    Types: Cigarettes    Quit date: 02/19/1998    Years since quitting: 23.9   Smokeless tobacco: Never  Substance and Sexual Activity   Alcohol use: Yes    Alcohol/week: 19.0 standard drinks of alcohol    Types: 15 Cans of beer, 4 Shots of liquor per week   Drug use: No   Sexual activity: Yes    Partners: Female  Other Topics Concern   Not on file  Social History Narrative   Not on file   Social Determinants of Health   Financial Resource Strain: Not on file  Food Insecurity: Not on file  Transportation Needs: Not on file  Physical Activity: Not on file  Stress: Not on file  Social Connections: Not on file  Intimate Partner Violence: Not on file    Family History  Problem Relation Age of Onset   Arthritis Mother    Heart disease Paternal Grandfather    Cancer Neg Hx    Hyperlipidemia Neg Hx    Hypertension Neg Hx    Stroke Neg Hx    Kidney  disease Neg Hx    COPD Neg Hx    Diabetes Neg Hx    Early death Neg Hx    Alcohol abuse Neg Hx      Review of Systems  Constitutional:  Positive for fever and malaise/fatigue.  HENT: Negative.  Negative for congestion and sore throat.   Respiratory:  Positive for cough.   Cardiovascular: Negative.  Negative for chest pain and palpitations.  Gastrointestinal:  Negative for abdominal pain, nausea and vomiting.  Genitourinary: Negative.  Negative for dysuria and hematuria.    Today's Vitals   01/31/22 1334  BP: 136/68  Pulse: 97  Temp: 98.9 F (37.2 C)  TempSrc: Oral  SpO2: 97%  Weight: 201 lb (91.2 kg)  Height: '6\' 1"'$  (1.854 m)   Body mass index is 26.52 kg/m.  Physical Exam Vitals  reviewed.  Constitutional:      Appearance: Normal appearance.  HENT:     Head: Normocephalic.     Right Ear: Tympanic membrane, ear canal and external ear normal.     Left Ear: Tympanic membrane, ear canal and external ear normal.     Mouth/Throat:     Mouth: Mucous membranes are moist.     Pharynx: Oropharynx is clear.  Eyes:     Extraocular Movements: Extraocular movements intact.     Conjunctiva/sclera: Conjunctivae normal.     Pupils: Pupils are equal, round, and reactive to light.  Cardiovascular:     Rate and Rhythm: Normal rate and regular rhythm.     Pulses: Normal pulses.     Heart sounds: Normal heart sounds.  Pulmonary:     Effort: Pulmonary effort is normal.     Breath sounds: Normal breath sounds.  Abdominal:     Palpations: Abdomen is soft.     Tenderness: There is no abdominal tenderness.  Musculoskeletal:     Cervical back: No tenderness.     Right lower leg: No edema.     Left lower leg: No edema.  Lymphadenopathy:     Cervical: No cervical adenopathy.  Skin:    General: Skin is warm and dry.  Neurological:     General: No focal deficit present.     Mental Status: He is alert and oriented to person, place, and time.  Psychiatric:        Mood and Affect: Mood normal.        Behavior: Behavior normal.      ASSESSMENT & PLAN: A total of 33 minutes was spent with the patient and counseling/coordination of care regarding preparing for this visit, review of available medical records and most recent office visit notes, review of past medical history, diagnosis of viral illness and management, need for blood work today, prognosis, documentation and need for follow-up if no better or worse during the next several days.  Problem List Items Addressed This Visit       Other   Viral illness - Primary    Running its course without complication. No red flag signs or symptoms. No chronic medical conditions. Blood work done today. Advised to rest and stay  well-hydrated Advised to contact the office if clinical picture changes or gets worse during the next several days.      Relevant Orders   CBC with Differential/Platelet   Comprehensive metabolic panel   TSH   Other Visit Diagnoses     Fatigue, unspecified type       Low grade fever  Patient Instructions  Viral Illness, Adult Viruses are tiny germs that can get into a person's body and cause illness. There are many different types of viruses, and they cause many types of illness. Viral illnesses can range from mild to severe. They can affect various parts of the body. Short-term conditions that are caused by a virus include colds and the flu (influenza). Long-term conditions that are caused by a virus include herpes, shingles, and HIV (human immunodeficiency virus) infection. A few viruses have been linked to certain cancers. What are the causes? Many types of viruses can cause illness. Viruses invade cells in your body, multiply, and cause the infected cells to work abnormally or die. When these cells die, they release more of the virus. When this happens, you develop symptoms of the illness, and the virus continues to spread to other cells. If the virus takes over the function of the cell, it can cause the cell to divide and grow out of control. This happens when a virus causes cancer. Different viruses get into the body in different ways. You can get a virus by: Swallowing food or water that has come in contact with the virus (is contaminated). Breathing in droplets that have been coughed or sneezed into the air by an infected person. Touching a surface that has been contaminated with the virus and then touching your eyes, nose, or mouth. Being bitten by an insect or animal that carries the virus. Having sexual contact with a person who is infected with the virus. Being exposed to blood or fluids that contain the virus, either through an open cut or during a transfusion. If a  virus enters your body, your body's defense system (immune system) will try to fight the virus. You may be at higher risk for a viral illness if your immune system is weak. What are the signs or symptoms? You may have these symptoms, depending on the type of virus and the location of the cells that it invades: Cold and flu viruses: Fever. Headache. Sore throat. Muscle aches. Stuffy nose (nasal congestion). Cough. Digestive system (gastrointestinal) viruses: Fever. Pain in the abdomen. Nausea. Diarrhea. Liver viruses (hepatitis): Loss of appetite. Tiredness. Skin or the white parts of your eyes turning yellow (jaundice). Brain and spinal cord viruses: Fever. Headache. Stiff neck. Nausea and vomiting. Confusion or sleepiness. Skin viruses: Warts. Itching. Rash. Sexually transmitted viruses: Discharge. Swelling. Redness. Rash. How is this diagnosed? This condition may be diagnosed based on one or more of the following: Symptoms. Medical history. Physical exam. Blood test, sample of mucus from your lungs (sputum sample), stool sample, or a swab of body fluids or a skin sore (lesion). How is this treated? Viruses can be hard to treat because they live within cells. Antibiotic medicines do not treat viruses because these medicines do not get inside cells. Treatment for a viral illness may include: Resting and drinking plenty of fluids. Medicines to relieve symptoms. These can include over-the-counter medicine for pain and fever, medicines for cough or congestion, and medicines to relieve diarrhea. Antiviral medicines. These medicines are available only for certain types of viruses. Some viral illnesses can be prevented with vaccinations. A common example is the flu shot. Follow these instructions at home: Medicines Take over-the-counter and prescription medicines only as told by your health care provider. If you were prescribed an antiviral medicine, take it as told by your  health care provider. Do not stop taking the antiviral even if you start to feel better. Be aware  of when antibiotics are needed and when they are not needed. Antibiotics do not treat viruses. You may get an antibiotic if your health care provider thinks that you may have, or are at risk for, a bacterial infection and you have a viral infection. Do not ask for an antibiotic prescription if you have been diagnosed with a viral illness. Antibiotics will not make your illness go away faster. Frequently taking antibiotics when they are not needed can lead to antibiotic resistance. When this develops, the medicine no longer works against the bacteria that it normally fights. General instructions  Drink enough fluids to keep your urine pale yellow. Rest as much as possible. Return to your normal activities as told by your health care provider. Ask your health care provider what activities are safe for you. Keep all follow-up visits as told by your health care provider. This is important. How is this prevented? To reduce your risk of viral illness: Wash your hands often with soap and water for at least 20 seconds. If soap and water are not available, use hand sanitizer. Avoid touching your nose, eyes, and mouth, especially if you have not washed your hands recently. If anyone in your household has a viral infection, clean all household surfaces that may have been in contact with the virus. Use soap and hot water. You may also use bleach that you have added water to (diluted). Stay away from people who are sick with symptoms of a viral infection. Do not share items such as toothbrushes and water bottles with other people. Keep your vaccinations up to date. This includes getting a yearly flu shot. Eat a healthy diet and get plenty of rest. Contact a health care provider if: You have symptoms of a viral illness that do not go away. Your symptoms come back after going away. Your symptoms get worse. Get  help right away if you have: Trouble breathing. A severe headache or a stiff neck. Severe vomiting or pain in your abdomen. These symptoms may represent a serious problem that is an emergency. Do not wait to see if the symptoms will go away. Get medical help right away. Call your local emergency services (911 in the [place] Do not drive yourself to the hospital. Summary Viruses are types of germs that can get into a person's body and cause illness. Viral illnesses can range from mild to severe. They can affect various parts of the body. Viruses can be hard to treat. There are medicines to relieve symptoms, and there are some antiviral medicines. If you were prescribed an antiviral medicine, take it as told by your health care provider. Do not stop taking the antiviral even if you start to feel better. Contact a health care provider if you have symptoms of a viral illness that do not go away. This information is not intended to replace advice given to you by your health care provider. Make sure you discuss any questions you have with your health care provider. Document Revised: 06/22/2019 Document Reviewed: 12/16/2018 Elsevier Patient Education  [place], MD [facility] at [facility]

## 2022-01-31 NOTE — Patient Instructions (Signed)

## 2022-01-31 NOTE — Telephone Encounter (Signed)
CRITICAL VALUE STICKER  CRITICAL VALUE: 18.8 WBC  RECEIVER (on-site recipient of call): Verdis Frederickson    DATE & TIME NOTIFIED: 01/31/2022 '@3'$ :28 pm  MESSENGER (representative from lab): Santiago Glad  MD NOTIFIED: Yes  TIME OF NOTIFICATION: 3:30 pm  RESPONSE:  He will handle it.

## 2022-02-01 ENCOUNTER — Ambulatory Visit: Payer: 59 | Admitting: Emergency Medicine

## 2022-08-12 IMAGING — DX DG KNEE AP/LAT W/ SUNRISE*L*
3 series · 3 of 3 positions shown · non-contrast
Comparison: None.

CLINICAL DATA: Medial left knee pain after twist injury 9 days ago.

EXAM:
LEFT KNEE 3 VIEWS

[knee ap]
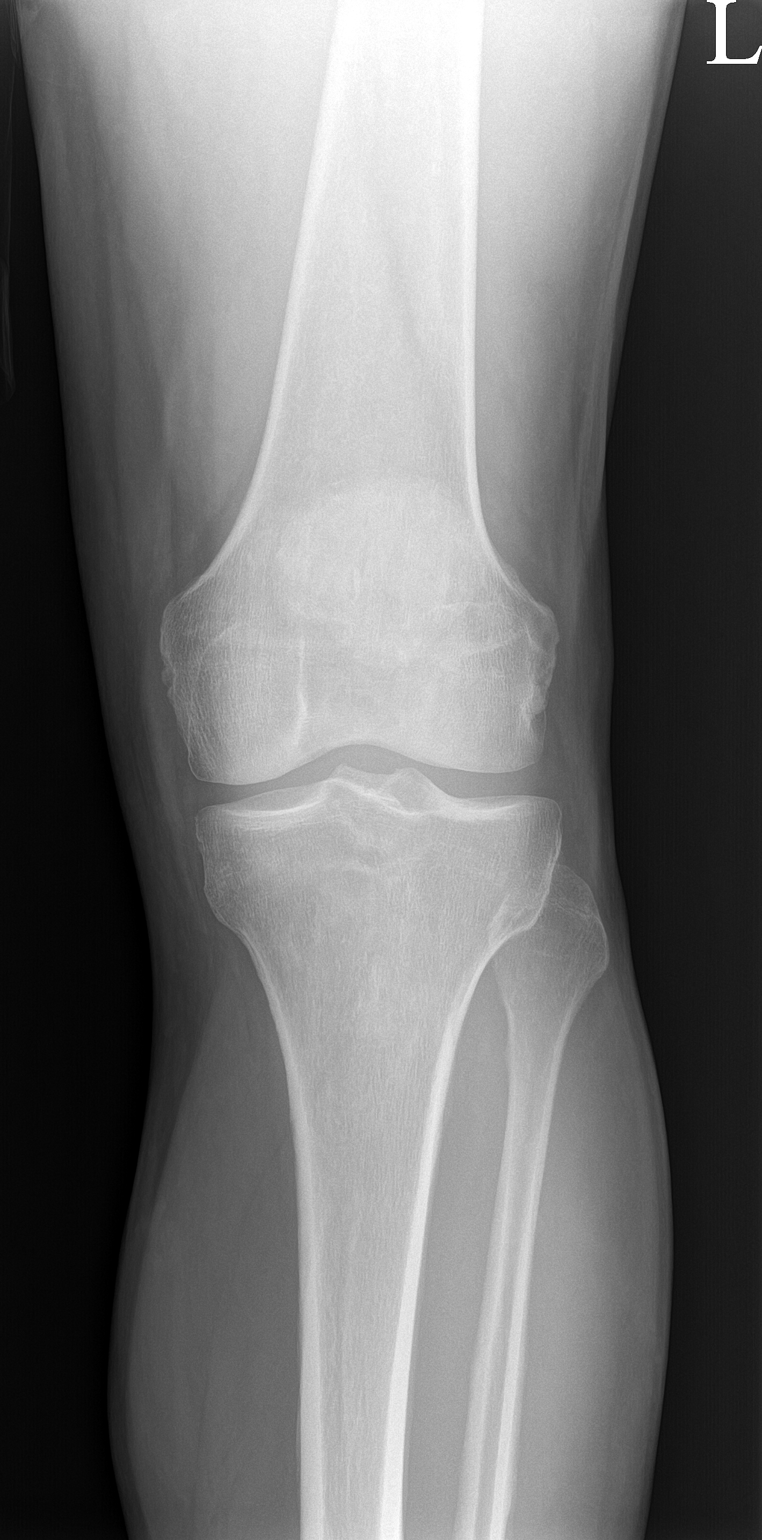

[knee lat]
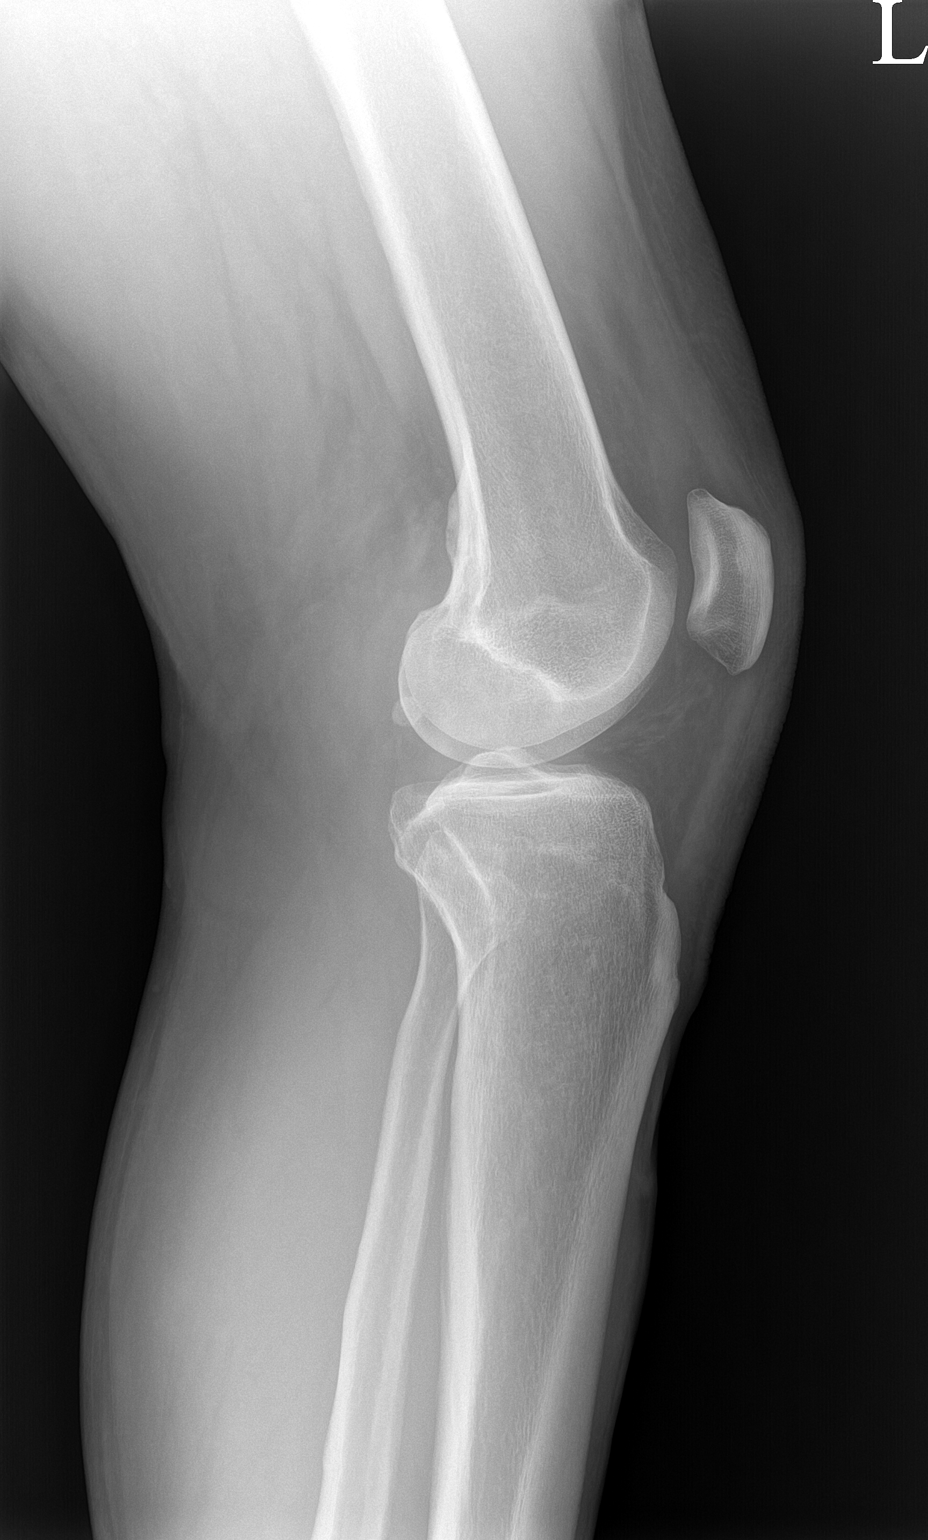

[patella]
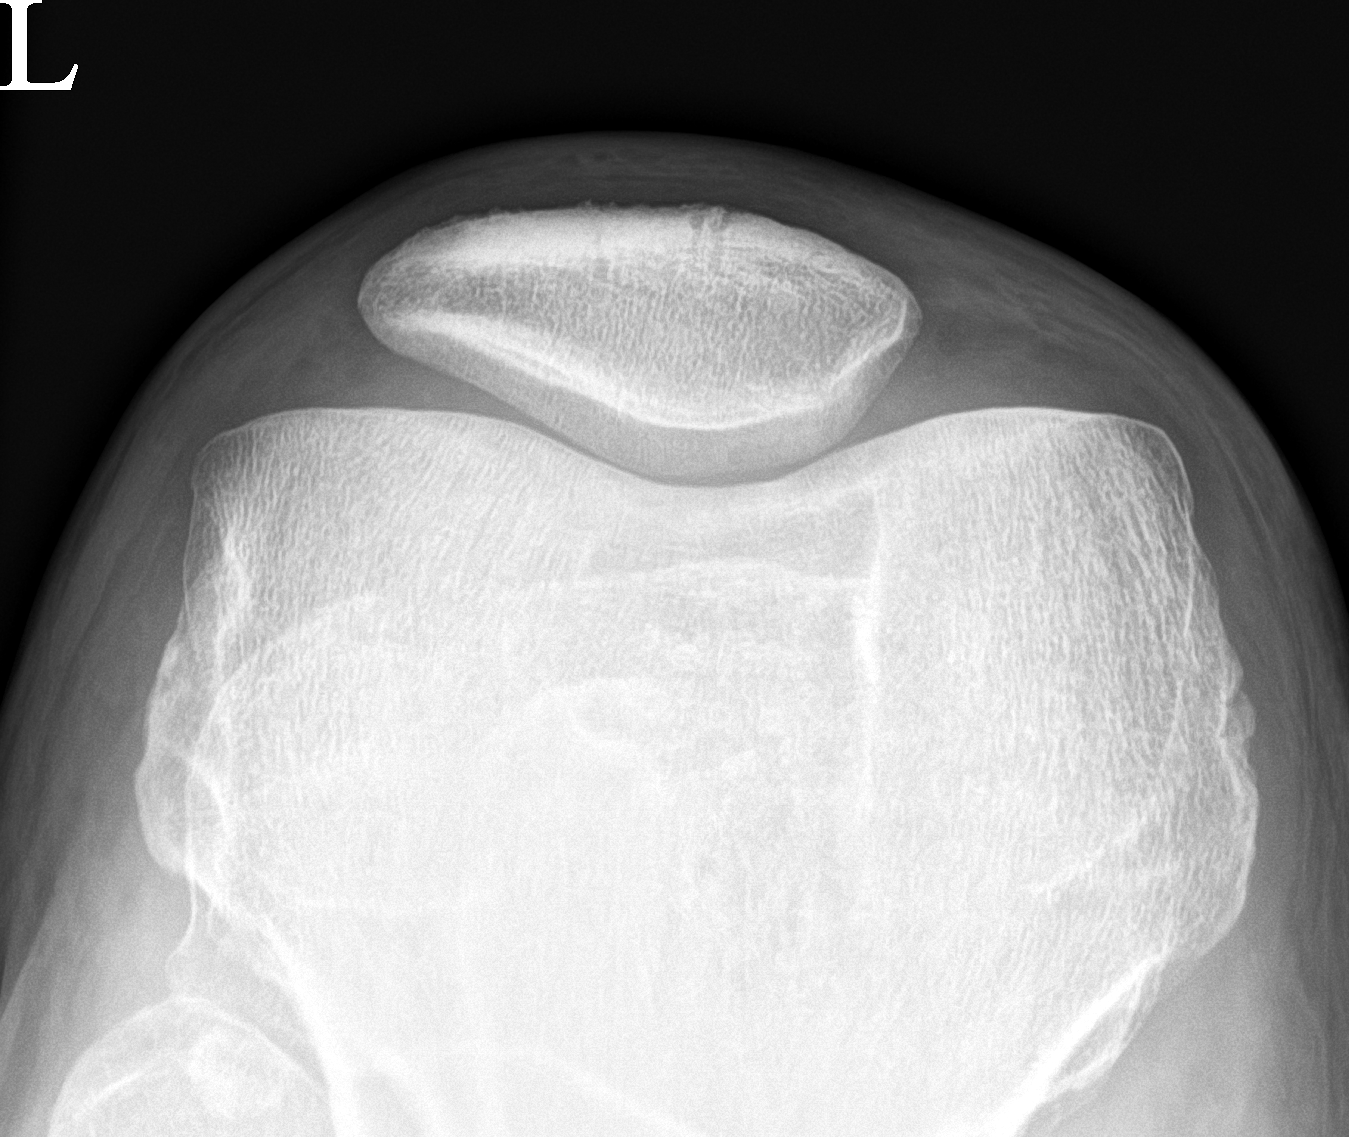

[3 of 3 positions shown; findings below may reference images not displayed]

FINDINGS: Normal bone mineralization. Joint spaces are preserved. Mild patella
alta. No joint effusion. Minimal superior patellar degenerative
spurring. No acute fracture or dislocation.
IMPRESSION: Mild patella alta and minimal superior patellar degenerative
spurring.

## 2022-10-15 ENCOUNTER — Ambulatory Visit: Payer: 59 | Admitting: Internal Medicine

## 2022-11-29 ENCOUNTER — Ambulatory Visit: Payer: 59 | Admitting: Nurse Practitioner

## 2022-11-29 VITALS — BP 100/70 | HR 88 | Temp 98.2°F | Ht 73.0 in | Wt 203.0 lb

## 2022-11-29 DIAGNOSIS — R059 Cough, unspecified: Secondary | ICD-10-CM | POA: Diagnosis not present

## 2022-11-29 LAB — POC COVID19 BINAXNOW: SARS Coronavirus 2 Ag: NEGATIVE

## 2022-11-29 LAB — POCT RAPID STREP A (OFFICE): Rapid Strep A Screen: NEGATIVE

## 2022-11-29 LAB — POCT RESPIRATORY SYNCYTIAL VIRUS: RSV Rapid Ag: NEGATIVE

## 2022-11-29 LAB — POCT INFLUENZA A/B
Influenza A, POC: NEGATIVE
Influenza B, POC: NEGATIVE

## 2022-11-29 NOTE — Progress Notes (Signed)
Established Patient Office Visit  Subjective   Patient ID: Samuel Wong, male    DOB: [birth year]  Age: 52 y.o. MRN: [id number]  Chief Complaint  Patient presents with   Chills    Body aches, chest congestion, chills sweat since Monday. Been taking ibuprofen. Been felling better on and off    Patient arrives today for acute visit for the above.  Symptom onset 3 days ago.  Experiencing consistent fever, as high as 101.8, has been treating with ibuprofen every 4-6 hours.  Reports last dose was around 5 AM.  He is also experiencing chills, body aches, fatigue, dry cough, and headache.  Reports that he took an at home COVID test yesterday which was negative.     Review of Systems  Constitutional:  Positive for chills, diaphoresis, fever and malaise/fatigue.  HENT:  Positive for congestion.   Respiratory:  Positive for cough. Negative for sputum production, shortness of breath and wheezing.   Cardiovascular:  Negative for chest pain.  Musculoskeletal:  Positive for myalgias.  Neurological:  Positive for headaches.      Objective:     BP 100/70   Pulse 88   Temp 98.2 F (36.8 C) (Temporal)   Ht 6\' 1"  (1.854 m)   Wt 203 lb (92.1 kg)   SpO2 98%   BMI 26.78 kg/m  BP Readings from Last 3 Encounters:  11/29/22 100/70  01/31/22 136/68  04/24/21 124/76   Wt Readings from Last 3 Encounters:  11/29/22 203 lb (92.1 kg)  01/31/22 201 lb (91.2 kg)  04/24/21 200 lb 6.4 oz (90.9 kg)      Physical Exam Vitals reviewed.  Constitutional:      Appearance: Normal appearance.  HENT:     Head: Normocephalic and atraumatic.     Right Ear: Hearing, tympanic membrane, ear canal and external ear normal.     Left Ear: Hearing, tympanic membrane, ear canal and external ear normal.     Mouth/Throat:     Pharynx: Posterior oropharyngeal erythema present. No oropharyngeal exudate.  Cardiovascular:     Rate and Rhythm: Normal rate and regular rhythm.  Pulmonary:     Effort: Pulmonary  effort is normal.     Breath sounds: Normal breath sounds.  Musculoskeletal:     Cervical back: Neck supple.  Skin:    General: Skin is warm and dry.  Neurological:     Mental Status: He is alert and oriented to person, place, and time.  Psychiatric:        Mood and Affect: Mood normal.        Behavior: Behavior normal.        Thought Content: Thought content normal.        Judgment: Judgment normal.      Results for orders placed or performed in visit on 11/29/22  POC COVID-19 BinaxNow  Result Value Ref Range   SARS Coronavirus 2 Ag Negative Negative  POCT rapid strep A  Result Value Ref Range   Rapid Strep A Screen Negative Negative  POCT Influenza A/B  Result Value Ref Range   Influenza A, POC Negative Negative   Influenza B, POC Negative Negative  POCT respiratory syncytial virus  Result Value Ref Range   RSV Rapid Ag negative       The ASCVD Risk score (Arnett DK, et al., 2019) failed to calculate for the following reasons:   Cannot find a previous HDL lab   Cannot find a previous total cholesterol lab  Assessment & Plan:   Problem List Items Addressed This Visit       Other   Cough - Primary    Acute, seems to be persistent with viral infection Point-of-care COVID, strep, flu, RSV all negative today Recommend symptom management with rest, hydration, as needed ibuprofen for muscle aches, headache, or fever. Patient told to reach out to office if symptoms persist past 10 days or worsen at which point may consider treatment with antibiotic.  He reports his understanding.      Relevant Orders   POC COVID-19 BinaxNow (Completed)   POCT rapid strep A (Completed)   POCT Influenza A/B (Completed)   POCT respiratory syncytial virus (Completed)    Return if symptoms worsen or fail to improve.    [name], NP

## 2022-11-29 NOTE — Assessment & Plan Note (Signed)
Acute, seems to be persistent with viral infection Point-of-care COVID, strep, flu, RSV all negative today Recommend symptom management with rest, hydration, as needed ibuprofen for muscle aches, headache, or fever. Patient told to reach out to office if symptoms persist past 10 days or worsen at which point may consider treatment with antibiotic.  He reports his understanding.

## 2022-11-30 ENCOUNTER — Encounter: Payer: Self-pay | Admitting: Internal Medicine

## 2022-11-30 ENCOUNTER — Other Ambulatory Visit: Payer: Self-pay | Admitting: Nurse Practitioner

## 2022-11-30 DIAGNOSIS — R059 Cough, unspecified: Secondary | ICD-10-CM

## 2022-11-30 MED ORDER — DOXYCYCLINE HYCLATE 100 MG PO TABS
100.0000 mg | ORAL_TABLET | Freq: Two times a day (BID) | ORAL | 0 refills | Status: DC
Start: 2022-11-30 — End: 2023-04-18

## 2023-04-18 ENCOUNTER — Ambulatory Visit: Payer: No Typology Code available for payment source | Admitting: Nurse Practitioner

## 2023-04-18 ENCOUNTER — Encounter: Payer: Self-pay | Admitting: Nurse Practitioner

## 2023-04-18 ENCOUNTER — Other Ambulatory Visit: Payer: Self-pay | Admitting: Nurse Practitioner

## 2023-04-18 ENCOUNTER — Ambulatory Visit: Payer: Self-pay | Admitting: Internal Medicine

## 2023-04-18 ENCOUNTER — Ambulatory Visit (INDEPENDENT_AMBULATORY_CARE_PROVIDER_SITE_OTHER): Payer: No Typology Code available for payment source

## 2023-04-18 VITALS — BP 100/66 | HR 79 | Temp 99.0°F | Ht 73.0 in | Wt 204.1 lb

## 2023-04-18 DIAGNOSIS — S62601A Fracture of unspecified phalanx of left index finger, initial encounter for closed fracture: Secondary | ICD-10-CM | POA: Diagnosis not present

## 2023-04-18 DIAGNOSIS — T148XXA Other injury of unspecified body region, initial encounter: Secondary | ICD-10-CM

## 2023-04-18 NOTE — Telephone Encounter (Signed)
 Copied from CRM [id number]. Topic: Clinical - Red Word Triage >> Apr 18, 2023  8:02 AM Dennison Nancy wrote: Red Word that prompted transfer to Nurse Triage: left index finger ,swelling and sharp pain , Patient smashed his finger last week and its not getting better , he believe he broken   Chief Complaint: Finger Injury, Left Hand Symptoms: Pain with Movement, Edema, Discoloration Frequency: Since Last Week Pertinent Negatives: Patient denies numbness, weakness, tingling, or paralysis.  Disposition: [] ED /[] Urgent Care (no appt availability in office) / [x] Appointment(In office/virtual)/ []  [facility]/ [] Home Care/ [] Refused Recommended Disposition /[] [facility]/ []  Follow-up with PCP Additional Notes: JS is being triaged for a finger injury, pain, swelling, and limited use after smashing his finger in a window last week. The patient reports no pain at rest but moderate, sharp pain with certain movements of the finger. Without movement or at rest, there is no pain. The patient reports having a makeshift splint on the finger to prevent pain producing movements. In office appointment made for today with another provider in office.    Reason for Disposition  Finger joint can't be opened (straightened) or closed (bent) completely  (Note: injured person should be able to do this without assistance)  Answer Assessment - Initial Assessment Questions 1. MECHANISM: "How did the injury happen?"      Smashed his finger in a window  2. ONSET: "When did the injury happen?" (Minutes or hours ago)      Last week  3. LOCATION: "What part of the finger is injured?" "Is the nail damaged?"      Thumb and Index Finger, Left.   4. APPEARANCE of the INJURY: "What does the injury look like?"      Edematous, Some Discoloration  5. SEVERITY: "Can you use the hand normally?"  "Can you bend your fingers into a ball and then fully open them?"     Extension of the finger causes pain  6. SIZE: For  cuts, bruises, or swelling, ask: "How large is it?" (e.g., inches or centimeters;  entire finger)      Swelling  7. PAIN: "Is there pain?" If Yes, ask: "How bad is the pain?"    (e.g., Scale 1-10; or mild, moderate, severe)  - NONE (0): no pain.  - MILD (1-3): doesn't interfere with normal activities.   - MODERATE (4-7): interferes with normal activities or awakens from sleep.  - SEVERE (8-10): excruciating pain, unable to hold a glass of water or bend finger even a little.     Discomfort with movement, Mild, none with rest  8. TETANUS: For any breaks in the skin, ask: "When was the last tetanus booster?"     No  9. OTHER SYMPTOMS: "Do you have any other symptoms?"     No  Protocols used: Finger Injury-A-AH

## 2023-04-18 NOTE — Assessment & Plan Note (Signed)
 Pain in finger, likely broken. Will get Xray to confirm and consider referral to hand surgeon if needed.  For now I recommend he get an over the counter finger immobilizer as opposed to using his makeshift splint. I also recommend use of ice and tylenol/ibuprofen per package instructions as needed for pain management.  No evidence of infection on exam, no evidence of compartment syndrome. No broken skin on exam and patient denies broken skin at time of injury, last Tdap within 10 years no recommendation to update booster.

## 2023-04-18 NOTE — Progress Notes (Signed)
   Established Patient Office Visit  Subjective   Patient ID: Samuel Wong, male    DOB: [birth year]  Age: 53 y.o. MRN: [id number]  Chief Complaint  Patient presents with   Hand Pain    Broken index finger, sharp pain to and discoloration, happened about a week ago      Patient has for acute visit for the above. About 1 week ago he was trying to open a window and jammed his finger against the window.  Since then he has been experiencing pain in his thumb and second digit of the left hand.  Thumb pain has mostly resolved but is still experiencing pain in the second digit especially with flexion.  He has made himself a splint and has been wearing that to help maintain proper alignment.  No numbness or tingling.  Denies any wound or opening in the skin.  Pain only occurs when trying to move the finger.  He is concerned it may be broken.    ROS: see HPI    Objective:     BP 100/66   Pulse 79   Temp 99 F (37.2 C) (Temporal)   Ht 6\' 1"  (1.854 m)   Wt 204 lb 2 oz (92.6 kg)   SpO2 97%   BMI 26.93 kg/m    Physical Exam Musculoskeletal:     Right hand: Normal.     Left hand: Swelling (2nd digit) and tenderness present. No deformity. Decreased range of motion. Normal sensation. Normal capillary refill. Normal pulse.      No results found for any visits on 04/18/23.    The ASCVD Risk score (Arnett DK, et al., 2019) failed to calculate for the following reasons:   Cannot find a previous HDL lab   Cannot find a previous total cholesterol lab    Assessment & Plan:   Problem List Items Addressed This Visit       Musculoskeletal and Integument   Closed nondisplaced fracture of phalanx of left index finger - Primary   Pain in finger, likely broken. Will get Xray to confirm and consider referral to hand surgeon if needed.  For now I recommend he get an over the counter finger immobilizer as opposed to using his makeshift splint. I also recommend use of ice and  tylenol/ibuprofen per package instructions as needed for pain management.  No evidence of infection on exam, no evidence of compartment syndrome. No broken skin on exam and patient denies broken skin at time of injury, last Tdap within 10 years no recommendation to update booster.       Relevant Orders   DG Hand Complete Left    Return if symptoms worsen or fail to improve.    [name], NP

## 2023-04-22 ENCOUNTER — Ambulatory Visit (INDEPENDENT_AMBULATORY_CARE_PROVIDER_SITE_OTHER): Admitting: Physician Assistant

## 2023-04-22 ENCOUNTER — Encounter: Payer: Self-pay | Admitting: Physician Assistant

## 2023-04-22 DIAGNOSIS — S6292XA Unspecified fracture of left wrist and hand, initial encounter for closed fracture: Secondary | ICD-10-CM | POA: Insufficient documentation

## 2023-04-22 DIAGNOSIS — S62639A Displaced fracture of distal phalanx of unspecified finger, initial encounter for closed fracture: Secondary | ICD-10-CM | POA: Insufficient documentation

## 2023-04-22 NOTE — Progress Notes (Signed)
 Office Visit Note   Patient: Samuel Wong           Date of Birth: Apr 29, 1970           MRN: [id number] Visit Date: 04/22/2023              Requested by: [name], NP [address] [place],  [place] [address] PCP: Etta Grandchild, MD   Assessment & Plan: Visit Diagnoses:  1. Closed fracture of left hand, initial encounter     Plan: Patient is 10 days status post opening an old window when his index finger on his left hand got caught.  I reviewed the x-rays he has a very small avulsion fracture at the distal phalanx.  He has good motion and he has brisk capillary refill will put him in a mallet splint follow-up in 2 weeks with new x-rays  Follow-Up Instructions: No follow-ups on file.   Orders:  No orders of the defined types were placed in this encounter.  No orders of the defined types were placed in this encounter.     Procedures: No procedures performed   Clinical Data: No additional findings.   Subjective: Chief Complaint  Patient presents with   Left Hand - Pain    HPI patient is a pleasant 53 year old gentleman who is status post getting his left hand caught opening an old window finger got caught pain and hand initially then noticed some swelling and pain in the index finger.  This happened about 5 days ago.  He is right-hand dominant  Review of Systems  All other systems reviewed and are negative.    Objective: Vital Signs: There were no vitals taken for this visit.  Physical Exam Constitutional:      Appearance: Normal appearance.  Pulmonary:     Effort: Pulmonary effort is normal.  Skin:    General: Skin is warm and dry.  Neurological:     General: No focal deficit present.     Mental Status: He is alert and oriented to person, place, and time.     Ortho Exam Examination of his left hand splint was removed brisk capillary refill he does have some swelling pain and slight swelling at the DIP joint is able to flex and extend his  index finger at the MCP joint and the PIP joint has good opposition of all of his fingers he is pulses are intact he is neurovascularly intact Specialty Comments:  No specialty comments available.  Imaging: No results found.   PMFS History: Patient Active Problem List   Diagnosis Date Noted   Phalanx, distal fracture of finger 04/22/2023   Hand fracture, left 04/22/2023   Closed nondisplaced fracture of phalanx of left index finger 04/18/2023   Cough 11/29/2022   Viral illness 01/31/2022   Colon cancer screening 11/18/2019   Psoriasis 09/22/2014   Nonspecific abnormal electrocardiogram (ECG) (EKG) 09/22/2014   Past Medical History:  Diagnosis Date   Psoriasis     Family History  Problem Relation Age of Onset   Arthritis Mother    Heart disease Paternal Grandfather    Cancer Neg Hx    Hyperlipidemia Neg Hx    Hypertension Neg Hx    Stroke Neg Hx    Kidney disease Neg Hx    COPD Neg Hx    Diabetes Neg Hx    Early death Neg Hx    Alcohol abuse Neg Hx     Past Surgical History:  Procedure Laterality Date  EYE SURGERY     LAPAROSCOPIC APPENDECTOMY N/A 08/26/2015   Procedure: APPENDECTOMY LAPAROSCOPIC;  Surgeon: Abigail Miyamoto, MD;  Location: [facility] OR;  Service: General;  Laterality: N/A;   WISDOM TOOTH EXTRACTION     Social History   Occupational History   Not on file  Tobacco Use   Smoking status: Former    Current packs/day: 0.00    Types: Cigarettes    Quit date: 02/19/1998    Years since quitting: 25.1   Smokeless tobacco: Never  Substance and Sexual Activity   Alcohol use: Yes    Alcohol/week: 19.0 standard drinks of alcohol    Types: 15 Cans of beer, 4 Shots of liquor per week   Drug use: No   Sexual activity: Yes    Partners: Female

## 2023-05-06 ENCOUNTER — Encounter: Payer: Self-pay | Admitting: Physician Assistant

## 2023-05-06 ENCOUNTER — Other Ambulatory Visit: Payer: Self-pay

## 2023-05-06 ENCOUNTER — Ambulatory Visit (INDEPENDENT_AMBULATORY_CARE_PROVIDER_SITE_OTHER): Admitting: Physician Assistant

## 2023-05-06 DIAGNOSIS — S6292XA Unspecified fracture of left wrist and hand, initial encounter for closed fracture: Secondary | ICD-10-CM

## 2023-05-06 NOTE — Progress Notes (Signed)
 Office Visit Note   Patient: Samuel Wong           Date of Birth: August 03, 1970           MRN: [id number] Visit Date: 05/06/2023              Requested by: Etta Grandchild, MD [address] [place],  [place] [address] PCP: Etta Grandchild, MD  Chief Complaint  Patient presents with   Left Hand - Follow-up      HPI: Patient is a pleasant 53 year old gentleman who is now 3 weeks status post avulsion fracture of the distal phalanx of his left hand.  Of the index finger.  He has been immobilized in a mallet splint.  Is here in follow-up today He still has some soreness he has been wearing the splint though not wearing it today he is right-hand dominant but somewhat ambidextrous Assessment & Plan: Visit Diagnoses:  1. Closed fracture of left hand, initial encounter     Plan: I reviewed the x-rays with Dr. Fara Boros.  He feels this is minimal we will continue to have some swelling and pain for the next few weeks.  The splint can be worn optional belief if he is not better in a few weeks or not significantly better he will contact me.  The only thing I asked that he avoid his heavy lifting without hand  Follow-Up Instructions: Return if symptoms worsen or fail to improve.   Ortho Exam  Patient is alert, oriented, no adenopathy, well-dressed, normal affect, normal respiratory effort. Examination of his right index finger he has good range of motion though limited by little bit of swelling brisk capillary refill no evidence of infection or cellulitis he does have good flexion and extension of his hand  Imaging: No results found. No images are attached to the encounter.  Labs: No results found for: "HGBA1C", "ESRSEDRATE", "CRP", "LABURIC", "REPTSTATUS", "GRAMSTAIN", "CULT", "LABORGA"   Lab Results  Component Value Date   ALBUMIN 4.3 01/31/2022   ALBUMIN 4.0 08/26/2015   ALBUMIN 4.8 09/22/2014    No results found for: "MG" No results found for: "VD25OH"  No results found  for: "PREALBUMIN"    Latest Ref Rng & Units 01/31/2022    1:59 PM 11/18/2019    9:30 AM 08/26/2015    6:33 AM  CBC EXTENDED  WBC 4.0 - 10.5 K/uL 18.8 Repeated and verified X2.  6.7  20.1   RBC 4.22 - 5.81 Mil/uL 4.46  4.85  4.73   Hemoglobin 13.0 - 17.0 g/dL 21.3  08.6  57.8   HCT 39.0 - 52.0 % 39.4  43.7  40.8   Platelets 150.0 - 400.0 K/uL 369.0  293  239   NEUT# 1.4 - 7.7 K/uL 15.7  4,161    Lymph# 0.7 - 4.0 K/uL 1.3  1,541       There is no height or weight on file to calculate BMI.  Orders:  Orders Placed This Encounter  Procedures   XR Finger Index Left   No orders of the defined types were placed in this encounter.    Procedures: No procedures performed  Clinical Data: No additional findings.  ROS:  All other systems negative, except as noted in the HPI. Review of Systems  Objective: Vital Signs: There were no vitals taken for this visit.  Specialty Comments:  No specialty comments available.  PMFS History: Patient Active Problem List   Diagnosis Date Noted   Phalanx, distal fracture of  finger 04/22/2023   Hand fracture, left 04/22/2023   Closed nondisplaced fracture of phalanx of left index finger 04/18/2023   Cough 11/29/2022   Viral illness 01/31/2022   Colon cancer screening 11/18/2019   Psoriasis 09/22/2014   Nonspecific abnormal electrocardiogram (ECG) (EKG) 09/22/2014   Past Medical History:  Diagnosis Date   Psoriasis     Family History  Problem Relation Age of Onset   Arthritis Mother    Heart disease Paternal Grandfather    Cancer Neg Hx    Hyperlipidemia Neg Hx    Hypertension Neg Hx    Stroke Neg Hx    Kidney disease Neg Hx    COPD Neg Hx    Diabetes Neg Hx    Early death Neg Hx    Alcohol abuse Neg Hx     Past Surgical History:  Procedure Laterality Date   EYE SURGERY     LAPAROSCOPIC APPENDECTOMY N/A 08/26/2015   Procedure: APPENDECTOMY LAPAROSCOPIC;  Surgeon: Abigail Miyamoto, MD;  Location: [facility] OR;  Service: General;   Laterality: N/A;   WISDOM TOOTH EXTRACTION     Social History   Occupational History   Not on file  Tobacco Use   Smoking status: Former    Current packs/day: 0.00    Types: Cigarettes    Quit date: 02/19/1998    Years since quitting: 25.2   Smokeless tobacco: Never  Substance and Sexual Activity   Alcohol use: Yes    Alcohol/week: 19.0 standard drinks of alcohol    Types: 15 Cans of beer, 4 Shots of liquor per week   Drug use: No   Sexual activity: Yes    Partners: Female

## 2023-06-18 ENCOUNTER — Ambulatory Visit: Admitting: Physician Assistant

## 2023-06-19 ENCOUNTER — Ambulatory Visit: Admitting: Physician Assistant

## 2023-10-02 ENCOUNTER — Other Ambulatory Visit: Payer: Self-pay | Admitting: Gastroenterology

## 2023-10-02 ENCOUNTER — Ambulatory Visit

## 2023-10-02 VITALS — Ht 73.0 in | Wt 195.0 lb

## 2023-10-02 DIAGNOSIS — Z1211 Encounter for screening for malignant neoplasm of colon: Secondary | ICD-10-CM

## 2023-10-02 MED ORDER — NA SULFATE-K SULFATE-MG SULF 17.5-3.13-1.6 GM/177ML PO SOLN
1.0000 | Freq: Once | ORAL | 0 refills | Status: AC
Start: 2023-10-02 — End: 2023-10-02

## 2023-10-02 NOTE — Progress Notes (Signed)
 No egg or soy allergy known to patient  No issues known to pt with past sedation with any surgeries or procedures Patient denies ever being told they had issues or difficulty with intubation  No FH of Malignant Hyperthermia Pt is not on diet pills Pt is not on  home 02  Pt is not on blood thinners  Pt denies issues with constipation  No A fib or A flutter Have any cardiac testing pending--No Pt can ambulate  Pt denies use of chewing tobacco Discussed diabetic I weight loss medication holds Discussed NSAID holds Checked BMI Pt instructed to use [employer] or GoodRx for a price reduction on prep via prep instructions  Patient's chart reviewed  Pre visit completed.

## 2023-10-16 ENCOUNTER — Encounter: Admitting: Gastroenterology

## 2023-11-11 ENCOUNTER — Ambulatory Visit (INDEPENDENT_AMBULATORY_CARE_PROVIDER_SITE_OTHER)

## 2023-11-11 ENCOUNTER — Ambulatory Visit (INDEPENDENT_AMBULATORY_CARE_PROVIDER_SITE_OTHER): Admitting: Student

## 2023-11-11 ENCOUNTER — Telehealth: Payer: Self-pay | Admitting: Gastroenterology

## 2023-11-11 DIAGNOSIS — S83411A Sprain of medial collateral ligament of right knee, initial encounter: Secondary | ICD-10-CM | POA: Diagnosis not present

## 2023-11-11 DIAGNOSIS — M25561 Pain in right knee: Secondary | ICD-10-CM

## 2023-11-11 NOTE — Progress Notes (Signed)
 Chief Complaint: Right knee pain    Discussed the use of AI scribe software for clinical note transcription with the patient, who gave verbal consent to proceed.  History of Present Illness Samuel Wong is a 53 year old male who presents with acute right knee pain. He experiences sharp pain on the medial side of the right knee, which began suddenly at 3 AM after running to his car earlier that evening. The pain is persistent and exacerbated by certain movements. He has been icing the knee without improvement. A protruding area is noted on the medial side of the knee. There is no clicking or popping sensation. He has no prior knee problems or orthopedic surgeries. 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 He has not taken any medications for the knee pain due to an upcoming colonoscopy.   Surgical History:   None  PMH/PSH/Family History/Social History/Meds/Allergies:    Past Medical History:  Diagnosis Date   Psoriasis    Past Surgical History:  Procedure Laterality Date   EYE SURGERY     LAPAROSCOPIC APPENDECTOMY N/A 08/26/2015   Procedure: APPENDECTOMY LAPAROSCOPIC;  Surgeon: Vicenta Poli, MD;  Location: [facility] OR;  Service: General;  Laterality: N/A;   WISDOM TOOTH EXTRACTION     Social History   Socioeconomic History   Marital status: Married    Spouse name: Not on file   Number of children: Not on file   Years of education: Not on file   Highest education level: Not on file  Occupational History   Not on file  Tobacco Use   Smoking status: Former    Current packs/day: 0.00    Types: Cigarettes    Quit date: 02/19/1998    Years since quitting: 25.7   Smokeless tobacco: Never  Vaping Use   Vaping status: Never Used  Substance and Sexual Activity   Alcohol use: Yes    Alcohol/week: 19.0 standard drinks of alcohol    Types: 15 Cans of beer, 4 Shots of liquor per week    Drug use: No   Sexual activity: Yes    Partners: Female  Other Topics Concern   Not on file  Social History Narrative   Not on file   Social Drivers of Health   Financial Resource Strain: Not on file  Food Insecurity: Not on file  Transportation Needs: Not on file  Physical Activity: Not on file  Stress: Not on file  Social Connections: Not on file   Family History  Problem Relation Age of Onset   Arthritis Mother    Heart disease Paternal Grandfather    Cancer Neg Hx    Hyperlipidemia Neg Hx    Hypertension Neg Hx    Stroke Neg Hx    Kidney disease Neg Hx    COPD Neg Hx    Diabetes Neg Hx    Early death Neg Hx    Alcohol abuse Neg Hx    Colon cancer Neg Hx    Rectal cancer Neg Hx    Stomach cancer Neg Hx    Esophageal cancer Neg Hx    No Known Allergies Current Outpatient Medications  Medication Sig Dispense Refill   Multiple Vitamin (MULTIVITAMIN WITH MINERALS) TABS tablet Take 1 tablet by mouth daily after lunch.  valACYclovir  (VALTREX ) 1000 MG tablet Take 1 tablet (1,000 mg total) by mouth 2 (two) times daily. (Patient not taking: Reported on 10/02/2023) 10 tablet 0   No current facility-administered medications for this visit.   No results found.  Review of Systems:   A ROS was performed including pertinent positives and negatives as documented in the HPI.  Physical Exam :   Constitutional: NAD and appears stated age Neurological: Alert and oriented Psych: Appropriate affect and cooperative There were no vitals taken for this visit.   Comprehensive Musculoskeletal Exam:    Exam of the right knee demonstrates presence of a mild effusion without significant erythema or warmth.  Tenderness with palpation along the medial joint line.  Pain is elicited medially with valgus stress although no laxity is appreciated.  Active range of motion is from 0 to 120 degrees without crepitus.  Imaging:   Xray (right knee 4 views): No evidence of acute fracture or  dislocation.  Joint spaces in all 3 compartments well-maintained.  Small effusion present.   I personally reviewed and interpreted the radiographs.      Assessment & Plan Right knee medial collateral ligament (MCL) sprain   Acute medial right knee pain is likely due to a low-grade MCL sprain. X-ray shows no significant arthritis or bony abnormality. Mild swelling and warmth indicate inflammation pain is elicited with valgus stress. Although a meniscus injury is considered, current testing suggests the MCL sprain is the primary issue. Recommend a knee brace with lateral support for stabilization. Advise ibuprofen  for pain and inflammation as patient is planning to reschedule colonoscopy procedure.  Continue icing the knee to reduce swelling. Advise monitoring for new symptoms such as buckling, catching, locking, clicking, or popping, and report if he occurs.       I personally saw and evaluated the patient, and participated in the management and treatment plan.  Leonce Reveal, PA-C Orthopedics

## 2023-11-11 NOTE — Telephone Encounter (Signed)
 Good afternoon Dr. Stacia,    This patient called and stated that he was injured his knee and will have to cancel is procedure for now. Patient will call back to reschedule once his knee is feeling better. Patient appointment has been cancelled. Patient was schedule for a colonoscopy on September the 29 th. Please advise.   Thank you.

## 2023-11-18 ENCOUNTER — Encounter: Admitting: Gastroenterology

## 2023-12-23 ENCOUNTER — Encounter: Payer: Self-pay | Admitting: Radiology

## 2024-01-28 ENCOUNTER — Ambulatory Visit

## 2024-01-28 VITALS — Ht 73.0 in | Wt 195.0 lb

## 2024-01-28 DIAGNOSIS — Z1211 Encounter for screening for malignant neoplasm of colon: Secondary | ICD-10-CM

## 2024-01-28 MED ORDER — NA SULFATE-K SULFATE-MG SULF 17.5-3.13-1.6 GM/177ML PO SOLN: 1.0000 | Freq: Once | ORAL | 0 refills | Status: AC

## 2024-01-28 NOTE — Progress Notes (Signed)

## 2024-01-29 ENCOUNTER — Ambulatory Visit (INDEPENDENT_AMBULATORY_CARE_PROVIDER_SITE_OTHER): Admitting: Student

## 2024-01-29 DIAGNOSIS — M25561 Pain in right knee: Secondary | ICD-10-CM

## 2024-01-29 DIAGNOSIS — S83411D Sprain of medial collateral ligament of right knee, subsequent encounter: Secondary | ICD-10-CM | POA: Diagnosis not present

## 2024-01-29 NOTE — Progress Notes (Signed)
 Chief Complaint: Right knee pain    Discussed the use of AI scribe software for clinical note transcription with the patient, who gave verbal consent to proceed.  History of Present Illness Samuel Wong is a 53 year old male who presents with knee pain and popping following a prior knee injury.  He has persistent popping along the medial aspect of the previously injured knee. The popping is intermittent and sometimes painful. Overall symptoms are improving, but the popping remains. His pain is usually mild but can become sharp with certain bending positions. He bends frequently for work and is worried about reinjury. He tripped about six weeks ago and may have reinjured the knee, which he feels slowed improvement. He is not using a knee brace. He does not exercise the knee vigorously or load it heavily because he is worried about aggravating it, though current pain does not significantly limit daily activities. He is concerned about maintaining his activity level.   Surgical History:   None  PMH/PSH/Family History/Social History/Meds/Allergies:    Past Medical History:  Diagnosis Date   Psoriasis    Past Surgical History:  Procedure Laterality Date   EYE SURGERY     LAPAROSCOPIC APPENDECTOMY N/A 08/26/2015   Procedure: APPENDECTOMY LAPAROSCOPIC;  Surgeon: Vicenta Poli, MD;  Location: [facility] OR;  Service: General;  Laterality: N/A;   WISDOM TOOTH EXTRACTION     Social History   Socioeconomic History   Marital status: Married    Spouse name: Not on file   Number of children: Not on file   Years of education: Not on file   Highest education level: Not on file  Occupational History   Not on file  Tobacco Use   Smoking status: Former    Current packs/day: 0.00    Types: Cigarettes    Quit date: 02/19/1998    Years since quitting: 25.9   Smokeless tobacco: Never  Vaping Use   Vaping status: Never Used  Substance and Sexual Activity    Alcohol use: Yes    Alcohol/week: 10.0 standard drinks of alcohol    Types: 10 Cans of beer per week    Comment: rare liquor consumption   Drug use: No   Sexual activity: Yes    Partners: Female  Other Topics Concern   Not on file  Social History Narrative   Not on file   Social Drivers of Health   Financial Resource Strain: Not on file  Food Insecurity: Not on file  Transportation Needs: Not on file  Physical Activity: Not on file  Stress: Not on file  Social Connections: Not on file   Family History  Problem Relation Age of Onset   Arthritis Mother    Heart disease Paternal Grandfather    Cancer Neg Hx    Hyperlipidemia Neg Hx    Hypertension Neg Hx    Stroke Neg Hx    Kidney disease Neg Hx    COPD Neg Hx    Diabetes Neg Hx    Early death Neg Hx    Alcohol abuse Neg Hx    Colon cancer Neg Hx    Rectal cancer Neg Hx    Stomach cancer Neg Hx    Esophageal cancer Neg Hx    Colon polyps Neg Hx    No Known Allergies Current Outpatient  Medications  Medication Sig Dispense Refill   valACYclovir  (VALTREX ) 1000 MG tablet Take 1 tablet (1,000 mg total) by mouth 2 (two) times daily. (Patient not taking: Reported on 01/28/2024) 10 tablet 0   No current facility-administered medications for this visit.   No results found.  Review of Systems:   A ROS was performed including pertinent positives and negatives as documented in the HPI.  Physical Exam :   Constitutional: NAD and appears stated age Neurological: Alert and oriented Psych: Appropriate affect and cooperative There were no vitals taken for this visit.   Comprehensive Musculoskeletal Exam:    Exam of the right knee demonstrates tenderness palpation over the medial joint line.  Active range of motion from -3 to 135 degrees without crepitus.  No laxity or pain with varus or valgus stress.  Positive McMurray.  Imaging:        Assessment & Plan Right knee pain with possible meniscal tear   Intermittent right  knee pain with popping and sharp sensations suggests a possible meniscal tear, potentially linked to a previous MCL injury back in September.  Given his persistent pain with presence of mechanical symptoms and inability to resume full activity, I would like to proceed with an MRI for further evaluation particularly of the medial meniscus.  Discussed potential cortisone injection for symptomatic relief pending MRI however he would like to hold off on this at this time.  Will plan to see him back shortly after MRI for review and treatment discussion.    I personally saw and evaluated the patient, and participated in the management and treatment plan.  Leonce Reveal, PA-C Orthopedics

## 2024-02-04 ENCOUNTER — Encounter: Payer: Self-pay | Admitting: Gastroenterology

## 2024-02-04 ENCOUNTER — Ambulatory Visit: Admitting: Gastroenterology

## 2024-02-04 VITALS — BP 105/80 | HR 83 | Temp 98.4°F | Resp 14 | Ht 73.0 in | Wt 195.0 lb

## 2024-02-04 DIAGNOSIS — D122 Benign neoplasm of ascending colon: Secondary | ICD-10-CM

## 2024-02-04 DIAGNOSIS — D125 Benign neoplasm of sigmoid colon: Secondary | ICD-10-CM | POA: Diagnosis not present

## 2024-02-04 DIAGNOSIS — Z1211 Encounter for screening for malignant neoplasm of colon: Secondary | ICD-10-CM

## 2024-02-04 DIAGNOSIS — D123 Benign neoplasm of transverse colon: Secondary | ICD-10-CM

## 2024-02-04 MED ORDER — SODIUM CHLORIDE 0.9 % IV SOLN: 500.0000 mL | Freq: Once | INTRAVENOUS | Status: DC

## 2024-02-04 NOTE — Patient Instructions (Signed)

## 2024-02-04 NOTE — Op Note (Signed)
 [facility] Patient Name: Samuel Wong Procedure Date: 02/04/2024 1:37 PM MRN: [id number] Endoscopist: Glendia E. Stacia , MD, [id number] Age: 53 Referring MD:  Date of Birth: [birth year] Gender: Male Account #: [id number] Procedure:                Colonoscopy Indications:              Screening for colorectal malignant neoplasm, This                            is the patient's first colonoscopy Medicines:                Monitored Anesthesia Care Procedure:                Pre-Anesthesia Assessment:                           - Prior to the procedure, a History and Physical                            was performed, and patient medications and                            allergies were reviewed. The patient's tolerance of                            previous anesthesia was also reviewed. The risks                            and benefits of the procedure and the sedation                            options and risks were discussed with the patient.                            All questions were answered, and informed consent                            was obtained. Prior Anticoagulants: The patient has                            taken no anticoagulant or antiplatelet agents. ASA                            Grade Assessment: I - A normal, healthy patient.                            After reviewing the risks and benefits, the patient                            was deemed in satisfactory condition to undergo the                            procedure.  After obtaining informed consent, the colonoscope                            was passed under direct vision. Throughout the                            procedure, the patient's blood pressure, pulse, and                            oxygen saturations were monitored continuously. The                            CF [id number] #[id number] was introduced through the anus                            and advanced to the the  terminal ileum, with                            identification of the appendiceal orifice and IC                            valve. The colonoscopy was performed without                            difficulty. The patient tolerated the procedure                            well. The quality of the bowel preparation was                            good. The terminal ileum, ileocecal valve,                            appendiceal orifice, and rectum were photographed.                            The bowel preparation used was SUPREP via split                            dose instruction. Scope In: 1:53:21 PM Scope Out: 2:09:06 PM Scope Withdrawal Time: 0 hours 11 minutes 39 seconds  Total Procedure Duration: 0 hours 15 minutes 45 seconds  Findings:                 The perianal and digital rectal examinations were                            normal. Pertinent negatives include normal                            sphincter tone and no palpable rectal lesions.                           A 3 mm polyp was found in the ascending colon. The  polyp was sessile. The polyp was removed with a                            cold snare. Resection and retrieval were complete.                            Estimated blood loss was minimal.                           A 4 mm polyp was found in the transverse colon. The                            polyp was sessile. The polyp was removed with a                            cold snare. Resection and retrieval were complete.                            Estimated blood loss was minimal.                           A 6 mm polyp was found in the sigmoid colon. The                            polyp was semi-pedunculated. The polyp was removed                            with a cold snare. Resection and retrieval were                            complete. Estimated blood loss was minimal.                           The exam was otherwise normal throughout the                             examined colon.                           The terminal ileum appeared normal.                           The retroflexed view of the distal rectum and anal                            verge was normal and showed no anal or rectal                            abnormalities. Complications:            No immediate complications. Estimated Blood Loss:     Estimated blood loss was minimal. Impression:               - One 3 mm polyp in the ascending colon, removed  with a cold snare. Resected and retrieved.                           - One 4 mm polyp in the transverse colon, removed                            with a cold snare. Resected and retrieved.                           - One 6 mm polyp in the sigmoid colon, removed with                            a cold snare. Resected and retrieved.                           - The examined portion of the ileum was normal.                           - The distal rectum and anal verge are normal on                            retroflexion view. Recommendation:           - Patient has a contact number available for                            emergencies. The signs and symptoms of potential                            delayed complications were discussed with the                            patient. Return to normal activities tomorrow.                            Written discharge instructions were provided to the                            patient.                           - Resume previous diet.                           - Continue present medications.                           - Await pathology results.                           - Repeat colonoscopy (date not yet determined) for                            surveillance based on pathology results. Farrell Pantaleo E. Stacia, MD 02/04/2024 2:17:27 PM This report has been signed electronically.

## 2024-02-04 NOTE — Progress Notes (Signed)
 Called to room to assist during endoscopic procedure.  Patient ID and intended procedure confirmed with present staff. Received instructions for my participation in the procedure from the performing physician.

## 2024-02-04 NOTE — Progress Notes (Signed)
 [facility] History and Physical   Primary Care Physician:  Joshua Debby CROME, MD   Reason for Procedure:   Colon cancer screening  Plan:    Screening colonoscopy  HPI: Samuel Wong is a 53 y.o. male undergoing initial average risk screening colonoscopy.  He has no family history of colon cancer and no chronic GI symptoms.   The patient was provided an opportunity to ask questions and all were answered. The patient agreed with the plan    Past Medical History:  Diagnosis Date   Psoriasis     Past Surgical History:  Procedure Laterality Date   EYE SURGERY     LAPAROSCOPIC APPENDECTOMY N/A 08/26/2015   Procedure: APPENDECTOMY LAPAROSCOPIC;  Surgeon: Vicenta Poli, MD;  Location: [facility] OR;  Service: General;  Laterality: N/A;   WISDOM TOOTH EXTRACTION      Prior to Admission medications  Medication Sig Start Date End Date Taking? Authorizing Provider  Multiple Vitamin (MULTIVITAMIN) LIQD Take 5 mLs by mouth daily.   Yes [provider]  valACYclovir  (VALTREX ) 1000 MG tablet Take 1 tablet (1,000 mg total) by mouth 2 (two) times daily. Patient not taking: Reported on 10/02/2023 03/12/20   Katheen Roselie Rockford, NP    Current Outpatient Medications  Medication Sig Dispense Refill   Multiple Vitamin (MULTIVITAMIN) LIQD Take 5 mLs by mouth daily.     valACYclovir  (VALTREX ) 1000 MG tablet Take 1 tablet (1,000 mg total) by mouth 2 (two) times daily. (Patient not taking: Reported on 10/02/2023) 10 tablet 0   Current Facility-Administered Medications  Medication Dose Route Frequency Provider Last Rate Last Admin   0.9 %  sodium chloride  infusion  500 mL Intravenous Once Stacia Glendia BRAVO, MD        Allergies as of 02/04/2024   (No Known Allergies)    Family History  Problem Relation Age of Onset   Arthritis Mother    Heart disease Paternal Grandfather    Cancer Neg Hx    Hyperlipidemia Neg Hx    Hypertension Neg Hx    Stroke Neg Hx    Kidney disease Neg  Hx    COPD Neg Hx    Diabetes Neg Hx    Early death Neg Hx    Alcohol abuse Neg Hx    Colon cancer Neg Hx    Rectal cancer Neg Hx    Stomach cancer Neg Hx    Esophageal cancer Neg Hx    Colon polyps Neg Hx     Social History   Socioeconomic History   Marital status: Married    Spouse name: Not on file   Number of children: Not on file   Years of education: Not on file   Highest education level: Not on file  Occupational History   Not on file  Tobacco Use   Smoking status: Former    Current packs/day: 0.00    Average packs/day: 10.0 packs/day    Types: Cigarettes    Quit date: 02/19/1998    Years since quitting: 25.9   Smokeless tobacco: Never  Vaping Use   Vaping status: Never Used  Substance and Sexual Activity   Alcohol use: Yes    Alcohol/week: 10.0 standard drinks of alcohol    Types: 10 Cans of beer per week    Comment: rare liquor consumption   Drug use: No   Sexual activity: Yes    Partners: Female  Other Topics Concern   Not on file  Social History Narrative   Not  on file   Social Drivers of Health   Tobacco Use: Medium Risk (02/04/2024)   Patient History    Smoking Tobacco Use: Former    Smokeless Tobacco Use: Never    Passive Exposure: Not on Actuary Strain: Not on file  Food Insecurity: Not on file  Transportation Needs: Not on file  Physical Activity: Not on file  Stress: Not on file  Social Connections: Not on file  Intimate Partner Violence: Not on file  Depression (PHQ2-9): Low Risk (04/18/2023)   Depression (PHQ2-9)    PHQ-2 Score: 0  Alcohol Screen: Not on file  Housing: Not on file  Utilities: Not on file  Health Literacy: Not on file    Review of Systems:  All other review of systems negative except as mentioned in the HPI.  Physical Exam: Vital signs BP 97/77   Pulse 96   Temp 98.4 F (36.9 C) (Temporal)   Ht 6' 1 (1.854 m)   Wt 195 lb (88.5 kg)   SpO2 99%   BMI 25.73 kg/m   General:   Alert,   Well-developed, well-nourished, pleasant and cooperative in NAD Airway:  Mallampati 1 Lungs:  Clear throughout to auscultation.   Heart:  Regular rate and rhythm; no murmurs, clicks, rubs,  or gallops. Abdomen:  Soft, nontender and nondistended. Normal bowel sounds.   Neuro/Psych:  Normal mood and affect. A and O x 3   Dulcy Sida E. Stacia, MD [facility]

## 2024-02-04 NOTE — Progress Notes (Signed)
 A/o x 3, VSS, good SR's, pleased with anesthesia, report to RN

## 2024-02-05 ENCOUNTER — Telehealth: Payer: Self-pay

## 2024-02-05 NOTE — Telephone Encounter (Signed)
°  Follow up Call-     02/04/2024    1:02 PM  Call back number  Post procedure Call Back phone  # [phone]  Permission to leave phone message Yes    Attempted to call patient regarding follow-up, no answer. VM left for patient to return call with any questions or concerns.

## 2024-02-08 LAB — SURGICAL PATHOLOGY

## 2024-02-10 ENCOUNTER — Ambulatory Visit: Payer: Self-pay | Admitting: Gastroenterology

## 2024-02-10 NOTE — Progress Notes (Signed)
 Samuel Wong,   The three polyps that I removed during your recent procedure were completely benign but were proven to be pre-cancerous polyps that MAY have grown into cancers if they had not been removed.  Studies shows that at least 20% of women over age 55 and 30% of men over age 76 have pre-cancerous polyps.  Based on current nationally recognized surveillance guidelines, I recommend that you have a repeat colonoscopy in 5 years.   If you develop any new rectal bleeding, abdominal pain or significant bowel habit changes, please contact me before then.
